# Patient Record
Sex: Male | Born: 1955 | Race: White | Hispanic: No | Marital: Married | Smoking: Former smoker
Health system: Southern US, Community
[De-identification: ages and names within clinical notes are randomized; demographics above are authoritative.]

## PROBLEM LIST (undated history)

## (undated) DIAGNOSIS — J449 Chronic obstructive pulmonary disease, unspecified: Secondary | ICD-10-CM

## (undated) HISTORY — PX: OTHER SURGICAL HISTORY: SHX169

---

## 1998-12-23 ENCOUNTER — Emergency Department (HOSPITAL_COMMUNITY): Admission: EM | Admit: 1998-12-23 | Discharge: 1998-12-23 | Payer: Self-pay | Admitting: *Deleted

## 1999-07-14 ENCOUNTER — Emergency Department (HOSPITAL_COMMUNITY): Admission: EM | Admit: 1999-07-14 | Discharge: 1999-07-14 | Payer: Self-pay | Admitting: Emergency Medicine

## 2000-12-08 ENCOUNTER — Encounter: Payer: Self-pay | Admitting: Emergency Medicine

## 2000-12-08 ENCOUNTER — Emergency Department (HOSPITAL_COMMUNITY): Admission: EM | Admit: 2000-12-08 | Discharge: 2000-12-08 | Payer: Self-pay | Admitting: Emergency Medicine

## 2002-12-24 ENCOUNTER — Inpatient Hospital Stay (HOSPITAL_COMMUNITY): Admission: EM | Admit: 2002-12-24 | Discharge: 2002-12-25 | Payer: Self-pay

## 2002-12-24 ENCOUNTER — Encounter: Payer: Self-pay | Admitting: *Deleted

## 2002-12-24 ENCOUNTER — Encounter: Payer: Self-pay | Admitting: General Surgery

## 2003-02-03 ENCOUNTER — Encounter: Admission: RE | Admit: 2003-02-03 | Discharge: 2003-05-04 | Payer: Self-pay | Admitting: Orthopaedic Surgery

## 2003-05-05 ENCOUNTER — Encounter: Admission: RE | Admit: 2003-05-05 | Discharge: 2003-06-05 | Payer: Self-pay | Admitting: Orthopaedic Surgery

## 2003-07-25 ENCOUNTER — Encounter: Admission: RE | Admit: 2003-07-25 | Discharge: 2003-07-25 | Payer: Self-pay | Admitting: Orthopaedic Surgery

## 2003-08-09 ENCOUNTER — Emergency Department (HOSPITAL_COMMUNITY): Admission: EM | Admit: 2003-08-09 | Discharge: 2003-08-09 | Payer: Self-pay | Admitting: Emergency Medicine

## 2003-10-30 ENCOUNTER — Encounter: Admission: RE | Admit: 2003-10-30 | Discharge: 2003-12-25 | Payer: Self-pay | Admitting: Orthopaedic Surgery

## 2004-09-17 ENCOUNTER — Emergency Department (HOSPITAL_COMMUNITY): Admission: EM | Admit: 2004-09-17 | Discharge: 2004-09-17 | Payer: Self-pay | Admitting: Family Medicine

## 2004-10-15 ENCOUNTER — Emergency Department (HOSPITAL_COMMUNITY): Admission: EM | Admit: 2004-10-15 | Discharge: 2004-10-15 | Payer: Self-pay | Admitting: Family Medicine

## 2004-10-28 ENCOUNTER — Emergency Department (HOSPITAL_COMMUNITY): Admission: EM | Admit: 2004-10-28 | Discharge: 2004-10-28 | Payer: Self-pay | Admitting: Emergency Medicine

## 2004-11-01 ENCOUNTER — Ambulatory Visit: Payer: Self-pay | Admitting: Infectious Diseases

## 2005-10-08 ENCOUNTER — Emergency Department (HOSPITAL_COMMUNITY): Admission: EM | Admit: 2005-10-08 | Discharge: 2005-10-08 | Payer: Self-pay | Admitting: Emergency Medicine

## 2006-05-12 ENCOUNTER — Emergency Department (HOSPITAL_COMMUNITY): Admission: EM | Admit: 2006-05-12 | Discharge: 2006-05-12 | Payer: Self-pay | Admitting: Emergency Medicine

## 2006-05-23 ENCOUNTER — Emergency Department (HOSPITAL_COMMUNITY): Admission: EM | Admit: 2006-05-23 | Discharge: 2006-05-23 | Payer: Self-pay | Admitting: Emergency Medicine

## 2007-11-24 ENCOUNTER — Emergency Department (HOSPITAL_COMMUNITY): Admission: EM | Admit: 2007-11-24 | Discharge: 2007-11-24 | Payer: Self-pay | Admitting: Emergency Medicine
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
## 2011-01-28 LAB — POCT I-STAT, CHEM 8
Chloride: 103
Glucose, Bld: 123 — ABNORMAL HIGH
HCT: 36 — ABNORMAL LOW
Hemoglobin: 12.2 — ABNORMAL LOW

## 2015-08-12 ENCOUNTER — Emergency Department (HOSPITAL_COMMUNITY): Payer: Medicaid Other

## 2015-08-12 ENCOUNTER — Emergency Department (HOSPITAL_COMMUNITY)
Admission: EM | Admit: 2015-08-12 | Discharge: 2015-08-12 | Disposition: A | Discharge status: Home / Self Care | Payer: Medicaid Other | Attending: Emergency Medicine | Admitting: Emergency Medicine

## 2015-08-12 ENCOUNTER — Encounter (HOSPITAL_COMMUNITY): Payer: Self-pay | Admitting: *Deleted

## 2015-08-12 DIAGNOSIS — S3991XA Unspecified injury of abdomen, initial encounter: Secondary | ICD-10-CM | POA: Insufficient documentation

## 2015-08-12 DIAGNOSIS — S3992XA Unspecified injury of lower back, initial encounter: Secondary | ICD-10-CM | POA: Insufficient documentation

## 2015-08-12 DIAGNOSIS — Y9389 Activity, other specified: Secondary | ICD-10-CM | POA: Diagnosis not present

## 2015-08-12 DIAGNOSIS — Z23 Encounter for immunization: Secondary | ICD-10-CM | POA: Insufficient documentation

## 2015-08-12 DIAGNOSIS — S42401A Unspecified fracture of lower end of right humerus, initial encounter for closed fracture: Secondary | ICD-10-CM

## 2015-08-12 DIAGNOSIS — S42491A Other displaced fracture of lower end of right humerus, initial encounter for closed fracture: Secondary | ICD-10-CM | POA: Insufficient documentation

## 2015-08-12 DIAGNOSIS — S29002A Unspecified injury of muscle and tendon of back wall of thorax, initial encounter: Secondary | ICD-10-CM | POA: Insufficient documentation

## 2015-08-12 DIAGNOSIS — S199XXA Unspecified injury of neck, initial encounter: Secondary | ICD-10-CM | POA: Insufficient documentation

## 2015-08-12 DIAGNOSIS — F1110 Opioid abuse, uncomplicated: Secondary | ICD-10-CM | POA: Insufficient documentation

## 2015-08-12 DIAGNOSIS — Y92410 Unspecified street and highway as the place of occurrence of the external cause: Secondary | ICD-10-CM | POA: Insufficient documentation

## 2015-08-12 DIAGNOSIS — F17200 Nicotine dependence, unspecified, uncomplicated: Secondary | ICD-10-CM | POA: Insufficient documentation

## 2015-08-12 DIAGNOSIS — Y998 Other external cause status: Secondary | ICD-10-CM | POA: Diagnosis not present

## 2015-08-12 DIAGNOSIS — S0501XA Injury of conjunctiva and corneal abrasion without foreign body, right eye, initial encounter: Secondary | ICD-10-CM

## 2015-08-12 DIAGNOSIS — S29001A Unspecified injury of muscle and tendon of front wall of thorax, initial encounter: Secondary | ICD-10-CM | POA: Insufficient documentation

## 2015-08-12 DIAGNOSIS — F1310 Sedative, hypnotic or anxiolytic abuse, uncomplicated: Secondary | ICD-10-CM | POA: Diagnosis not present

## 2015-08-12 DIAGNOSIS — S4991XA Unspecified injury of right shoulder and upper arm, initial encounter: Secondary | ICD-10-CM | POA: Diagnosis present

## 2015-08-12 LAB — CBC
HCT: 46.7 % (ref 39.0–52.0)
Hemoglobin: 15.5 g/dL (ref 13.0–17.0)
MCH: 30.1 pg (ref 26.0–34.0)
MCHC: 33.2 g/dL (ref 30.0–36.0)
MCV: 90.7 fL (ref 78.0–100.0)
PLATELETS: 205 10*3/uL (ref 150–400)
RBC: 5.15 MIL/uL (ref 4.22–5.81)
RDW: 12.9 % (ref 11.5–15.5)
WBC: 14.9 10*3/uL — ABNORMAL HIGH (ref 4.0–10.5)

## 2015-08-12 LAB — SAMPLE TO BLOOD BANK

## 2015-08-12 LAB — COMPREHENSIVE METABOLIC PANEL
ALBUMIN: 3.8 g/dL (ref 3.5–5.0)
ALK PHOS: 70 U/L (ref 38–126)
ALT: 21 U/L (ref 17–63)
AST: 34 U/L (ref 15–41)
Anion gap: 9 (ref 5–15)
BILIRUBIN TOTAL: 0.8 mg/dL (ref 0.3–1.2)
CO2: 25 mmol/L (ref 22–32)
CREATININE: 0.82 mg/dL (ref 0.61–1.24)
Calcium: 9.1 mg/dL (ref 8.9–10.3)
Chloride: 107 mmol/L (ref 101–111)
GFR calc Af Amer: >60 mL/min (ref >60)
GLUCOSE: 102 mg/dL — AB (ref 65–99)
POTASSIUM: 4.7 mmol/L (ref 3.5–5.1)
Sodium: 141 mmol/L (ref 135–145)
TOTAL PROTEIN: 6.5 g/dL (ref 6.5–8.1)

## 2015-08-12 LAB — I-STAT TROPONIN, ED
Troponin i, poc: 0 ng/mL (ref 0.00–0.08)
Troponin i, poc: 0 ng/mL (ref 0.00–0.08)

## 2015-08-12 LAB — ETHANOL: Alcohol, Ethyl (B): <5 mg/dL (ref <5)

## 2015-08-12 LAB — I-STAT CHEM 8, ED
BUN: 4 mg/dL — ABNORMAL LOW (ref 6–20)
CHLORIDE: 106 mmol/L (ref 101–111)
CREATININE: 0.7 mg/dL (ref 0.61–1.24)
Calcium, Ion: 1.04 mmol/L — ABNORMAL LOW (ref 1.12–1.23)
Glucose, Bld: 94 mg/dL (ref 65–99)
HCT: 49 % (ref 39.0–52.0)
Hemoglobin: 16.7 g/dL (ref 13.0–17.0)
POTASSIUM: 5.1 mmol/L (ref 3.5–5.1)
Sodium: 140 mmol/L (ref 135–145)
TCO2: 23 mmol/L (ref 0–100)

## 2015-08-12 LAB — RAPID URINE DRUG SCREEN, HOSP PERFORMED
AMPHETAMINES: NOT DETECTED
BARBITURATES: NOT DETECTED
BENZODIAZEPINES: POSITIVE — AB
COCAINE: NOT DETECTED
Opiates: POSITIVE — AB
Tetrahydrocannabinol: NOT DETECTED

## 2015-08-12 LAB — TROPONIN I: Troponin I: <0.03 ng/mL (ref <0.031)

## 2015-08-12 LAB — PROTIME-INR
INR: 1.07 (ref 0.00–1.49)
PROTHROMBIN TIME: 14.1 s (ref 11.6–15.2)

## 2015-08-12 MED ORDER — TETANUS-DIPHTHERIA TOXOIDS TD 5-2 LFU IM INJ
0.5000 mL | INJECTION | Freq: Once | INTRAMUSCULAR | Status: DC
  Filled 2015-08-12: qty 0.5

## 2015-08-12 MED ORDER — FENTANYL CITRATE (PF) 100 MCG/2ML IJ SOLN
50.0000 ug | Freq: Once | INTRAMUSCULAR | Status: AC
  Administered 2015-08-12: 50 ug via INTRAVENOUS
  Filled 2015-08-12: qty 2

## 2015-08-12 MED ORDER — OXYCODONE-ACETAMINOPHEN 5-325 MG PO TABS: 2.0000 | ORAL_TABLET | ORAL | Status: DC | PRN

## 2015-08-12 MED ORDER — SODIUM CHLORIDE 0.9 % IV BOLUS (SEPSIS): 125.0000 mL | Freq: Once | INTRAVENOUS | Status: DC

## 2015-08-12 MED ORDER — TETRACAINE HCL 0.5 % OP SOLN
2.0000 [drp] | Freq: Once | OPHTHALMIC | Status: AC
  Administered 2015-08-12: 2 [drp] via OPHTHALMIC
  Filled 2015-08-12: qty 2

## 2015-08-12 MED ORDER — SODIUM CHLORIDE 0.9 % IV SOLN
1000.0000 mL | Freq: Once | INTRAVENOUS | Status: AC
  Administered 2015-08-12: 1000 mL via INTRAVENOUS

## 2015-08-12 MED ORDER — KETOROLAC TROMETHAMINE 30 MG/ML IJ SOLN
30.0000 mg | Freq: Once | INTRAMUSCULAR | Status: AC
Start: 2015-08-12 — End: 2015-08-12
  Administered 2015-08-12: 30 mg via INTRAVENOUS
  Filled 2015-08-12: qty 1

## 2015-08-12 MED ORDER — ERYTHROMYCIN 5 MG/GM OP OINT: 1.0000 "application " | TOPICAL_OINTMENT | Freq: Four times a day (QID) | OPHTHALMIC | Status: DC

## 2015-08-12 MED ORDER — SODIUM CHLORIDE 0.9 % IV SOLN
1000.0000 mL | INTRAVENOUS | Status: DC
  Administered 2015-08-12: 1000 mL via INTRAVENOUS

## 2015-08-12 MED ORDER — IOPAMIDOL (ISOVUE-300) INJECTION 61%
INTRAVENOUS | Status: AC
  Filled 2015-08-12: qty 100

## 2015-08-12 MED ORDER — OXYCODONE-ACETAMINOPHEN 5-325 MG PO TABS
1.0000 | ORAL_TABLET | Freq: Once | ORAL | Status: AC
Start: 2015-08-12 — End: 2015-08-12
  Administered 2015-08-12: 1 via ORAL
  Filled 2015-08-12: qty 1

## 2015-08-12 MED ORDER — HYDROMORPHONE HCL 1 MG/ML IJ SOLN
1.0000 mg | Freq: Once | INTRAMUSCULAR | Status: AC
  Administered 2015-08-12: 1 mg via INTRAVENOUS
  Filled 2015-08-12: qty 1

## 2015-08-12 MED ORDER — OXYCODONE HCL 5 MG PO TABS
5.0000 mg | ORAL_TABLET | Freq: Once | ORAL | Status: AC
Start: 2015-08-12 — End: 2015-08-12
  Administered 2015-08-12: 5 mg via ORAL
  Filled 2015-08-12: qty 1

## 2015-08-12 MED ORDER — TETANUS-DIPHTH-ACELL PERTUSSIS 5-2.5-18.5 LF-MCG/0.5 IM SUSP
0.5000 mL | Freq: Once | INTRAMUSCULAR | Status: AC
  Administered 2015-08-12: 0.5 mL via INTRAMUSCULAR
  Filled 2015-08-12: qty 0.5

## 2015-08-12 MED ORDER — FLUORESCEIN SODIUM 1 MG OP STRP
1.0000 | ORAL_STRIP | Freq: Once | OPHTHALMIC | Status: AC
  Administered 2015-08-12: 1 via OPHTHALMIC
  Filled 2015-08-12: qty 1

## 2015-08-12 MED ORDER — OXYCODONE-ACETAMINOPHEN 5-325 MG PO TABS
1.0000 | ORAL_TABLET | Freq: Once | ORAL | Status: AC
  Administered 2015-08-12: 1 via ORAL
  Filled 2015-08-12: qty 1
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
## 2015-08-12 NOTE — ED Notes (Signed)
Pt ambulate, pt tolerated well.  Gait steady.

## 2015-08-12 NOTE — ED Notes (Signed)
Family at bedside. 

## 2015-08-12 NOTE — ED Notes (Signed)
Ortho notified for splinting

## 2015-08-12 NOTE — ED Notes (Signed)
[employer]-  R 20/70  L 20/100

## 2015-08-12 NOTE — ED Notes (Signed)
Upgraded to Level 2 per MD Rancour
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
## 2015-08-12 NOTE — Discharge Instructions (Signed)
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
## 2015-08-12 NOTE — Progress Notes (Signed)
Orthopedic Tech Progress Note Patient Details:  Alben Deedsimothy C Covin [birth year] [id number] Applied fiberglass sugar tong splint to RUE.  Pulses, motion, sensation intact before and after splinting.  Capillary refill less than 2 seconds before and after splinting.  Placed splinted RUE in shoulder immobilizer sling.  Questioned ED MD about sugar tong splint as opposed to a coaptation splint for the humerus fx, but she stated she had checked with Dr. Lajoyce Cornersuda and he specified a sugar tong splint. Ortho Devices Type of Ortho Device: Sugartong splint, Sling immobilizer Ortho Device/Splint Location: RUE Ortho Device/Splint Interventions: Application   Lesle ChrisGilliland, Chiann Goffredo L 08/12/2015, 5:41 PM

## 2015-08-12 NOTE — ED Notes (Signed)
MD Rancour at bedside 
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
## 2015-08-12 NOTE — ED Notes (Signed)
C-collar removed-ok per MD Little.

## 2015-08-12 NOTE — ED Notes (Signed)
Pt placed in paper scrubs, all belongings given to daughter: April, wallet, phone, and clothing.

## 2015-08-13 MED ORDER — IOPAMIDOL (ISOVUE-300) INJECTION 61%
100.0000 mL | Freq: Once | INTRAVENOUS | Status: AC | PRN
  Administered 2015-08-12: 100 mL via INTRAVENOUS

## 2015-09-05 ENCOUNTER — Emergency Department (HOSPITAL_BASED_OUTPATIENT_CLINIC_OR_DEPARTMENT_OTHER)
Admission: EM | Admit: 2015-09-05 | Discharge: 2015-09-05 | Disposition: A | Discharge status: Home / Self Care | Payer: No Typology Code available for payment source | Attending: Emergency Medicine | Admitting: Emergency Medicine

## 2015-09-05 ENCOUNTER — Encounter (HOSPITAL_BASED_OUTPATIENT_CLINIC_OR_DEPARTMENT_OTHER): Payer: Self-pay | Admitting: Emergency Medicine

## 2015-09-05 ENCOUNTER — Emergency Department (HOSPITAL_BASED_OUTPATIENT_CLINIC_OR_DEPARTMENT_OTHER): Payer: No Typology Code available for payment source

## 2015-09-05 DIAGNOSIS — Y939 Activity, unspecified: Secondary | ICD-10-CM | POA: Insufficient documentation

## 2015-09-05 DIAGNOSIS — S4991XD Unspecified injury of right shoulder and upper arm, subsequent encounter: Secondary | ICD-10-CM | POA: Diagnosis present

## 2015-09-05 DIAGNOSIS — F17200 Nicotine dependence, unspecified, uncomplicated: Secondary | ICD-10-CM | POA: Diagnosis not present

## 2015-09-05 DIAGNOSIS — S42331D Displaced oblique fracture of shaft of humerus, right arm, subsequent encounter for fracture with routine healing: Secondary | ICD-10-CM | POA: Insufficient documentation

## 2015-09-05 DIAGNOSIS — G8929 Other chronic pain: Secondary | ICD-10-CM

## 2015-09-05 DIAGNOSIS — Y999 Unspecified external cause status: Secondary | ICD-10-CM | POA: Insufficient documentation

## 2015-09-05 DIAGNOSIS — Y929 Unspecified place or not applicable: Secondary | ICD-10-CM | POA: Insufficient documentation

## 2015-09-05 DIAGNOSIS — S42301K Unspecified fracture of shaft of humerus, right arm, subsequent encounter for fracture with nonunion: Secondary | ICD-10-CM

## 2015-09-05 MED ORDER — OXYCODONE HCL 5 MG PO TABS
5.0000 mg | ORAL_TABLET | ORAL | Status: DC | PRN
Start: 2015-09-05 — End: 2018-10-08

## 2015-09-05 MED ORDER — OXYCODONE HCL 5 MG PO TABS
10.0000 mg | ORAL_TABLET | Freq: Once | ORAL | Status: AC
  Administered 2015-09-05: 10 mg via ORAL
  Filled 2015-09-05: qty 2
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
## 2015-09-05 NOTE — Discharge Instructions (Signed)
Call Dr.Xu on Monday to make appointment to have splint repaired.  Follow-up with your regular primary care physician Dr. Thea SilversmithMackenzie to discuss additional narcotic medication.  Dr. Romilda JoyJanes major aware today that additional narcotics may potentially dilate your pain contract with Dr. Thea SilversmithMackenzie. You have expressed understanding of this.

## 2016-09-20 ENCOUNTER — Other Ambulatory Visit (INDEPENDENT_AMBULATORY_CARE_PROVIDER_SITE_OTHER): Payer: Self-pay | Admitting: Orthopaedic Surgery

## 2016-10-24 ENCOUNTER — Telehealth (INDEPENDENT_AMBULATORY_CARE_PROVIDER_SITE_OTHER): Payer: Self-pay

## 2016-10-24 MED ORDER — METHOCARBAMOL 500 MG PO TABS: 500.0000 mg | ORAL_TABLET | Freq: Three times a day (TID) | ORAL | 0 refills | Status: DC | PRN

## 2016-10-24 NOTE — Telephone Encounter (Signed)
Patient is requesting rxrf on muscle relaxer.  Request refill sent from patients pharmacy

## 2016-10-24 NOTE — Telephone Encounter (Signed)
Robaxin 500 mg tid prn #30

## 2016-10-24 NOTE — Telephone Encounter (Signed)
Refill submitted. 

## 2016-10-24 NOTE — Addendum Note (Signed)
Addended byPrescott Parma: Kaimana Lurz on: 10/24/2016 02:59 PM   Modules accepted: Orders
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
## 2018-10-08 ENCOUNTER — Encounter (HOSPITAL_COMMUNITY): Payer: Self-pay | Admitting: Psychiatry

## 2018-10-08 ENCOUNTER — Other Ambulatory Visit: Payer: Self-pay

## 2018-10-08 ENCOUNTER — Ambulatory Visit (INDEPENDENT_AMBULATORY_CARE_PROVIDER_SITE_OTHER): Payer: Medicaid Other | Admitting: Psychiatry

## 2018-10-08 DIAGNOSIS — F410 Panic disorder [episodic paroxysmal anxiety] without agoraphobia: Secondary | ICD-10-CM | POA: Diagnosis not present

## 2018-10-08 DIAGNOSIS — M549 Dorsalgia, unspecified: Secondary | ICD-10-CM | POA: Insufficient documentation

## 2018-10-08 DIAGNOSIS — F4321 Adjustment disorder with depressed mood: Secondary | ICD-10-CM | POA: Diagnosis not present

## 2018-10-08 DIAGNOSIS — F411 Generalized anxiety disorder: Secondary | ICD-10-CM

## 2018-10-08 DIAGNOSIS — J449 Chronic obstructive pulmonary disease, unspecified: Secondary | ICD-10-CM | POA: Insufficient documentation

## 2018-10-08 MED ORDER — VENLAFAXINE HCL ER 37.5 MG PO CP24: ORAL_CAPSULE | ORAL | 1 refills | Status: DC
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
## 2019-04-01 DIAGNOSIS — F329 Major depressive disorder, single episode, unspecified: Secondary | ICD-10-CM

## 2019-04-01 DIAGNOSIS — J189 Pneumonia, unspecified organism: Secondary | ICD-10-CM

## 2019-04-01 DIAGNOSIS — J441 Chronic obstructive pulmonary disease with (acute) exacerbation: Secondary | ICD-10-CM

## 2019-04-01 DIAGNOSIS — R0902 Hypoxemia: Secondary | ICD-10-CM

## 2019-05-14 ENCOUNTER — Encounter (HOSPITAL_COMMUNITY): Payer: Self-pay | Admitting: Emergency Medicine

## 2019-05-14 ENCOUNTER — Other Ambulatory Visit: Payer: Self-pay

## 2019-05-14 ENCOUNTER — Emergency Department (HOSPITAL_COMMUNITY)
Admission: EM | Admit: 2019-05-14 | Discharge: 2019-05-15 | Disposition: A | Discharge status: Home / Self Care | Payer: Medicaid Other | Attending: Emergency Medicine | Admitting: Emergency Medicine

## 2019-05-14 DIAGNOSIS — Z87891 Personal history of nicotine dependence: Secondary | ICD-10-CM | POA: Insufficient documentation

## 2019-05-14 DIAGNOSIS — Y9389 Activity, other specified: Secondary | ICD-10-CM | POA: Diagnosis not present

## 2019-05-14 DIAGNOSIS — Y92009 Unspecified place in unspecified non-institutional (private) residence as the place of occurrence of the external cause: Secondary | ICD-10-CM | POA: Diagnosis not present

## 2019-05-14 DIAGNOSIS — T310 Burns involving less than 10% of body surface: Secondary | ICD-10-CM | POA: Diagnosis not present

## 2019-05-14 DIAGNOSIS — T300 Burn of unspecified body region, unspecified degree: Secondary | ICD-10-CM

## 2019-05-14 DIAGNOSIS — X04XXXA Exposure to ignition of highly flammable material, initial encounter: Secondary | ICD-10-CM | POA: Diagnosis not present

## 2019-05-14 DIAGNOSIS — Z23 Encounter for immunization: Secondary | ICD-10-CM | POA: Diagnosis not present

## 2019-05-14 DIAGNOSIS — J449 Chronic obstructive pulmonary disease, unspecified: Secondary | ICD-10-CM | POA: Insufficient documentation

## 2019-05-14 DIAGNOSIS — Z79899 Other long term (current) drug therapy: Secondary | ICD-10-CM | POA: Diagnosis not present

## 2019-05-14 DIAGNOSIS — T2029XA Burn of second degree of multiple sites of head, face, and neck, initial encounter: Secondary | ICD-10-CM | POA: Diagnosis not present

## 2019-05-14 DIAGNOSIS — Z9981 Dependence on supplemental oxygen: Secondary | ICD-10-CM | POA: Diagnosis not present

## 2019-05-14 DIAGNOSIS — Y999 Unspecified external cause status: Secondary | ICD-10-CM | POA: Diagnosis not present

## 2019-05-14 DIAGNOSIS — T2019XA Burn of first degree of multiple sites of head, face, and neck, initial encounter: Secondary | ICD-10-CM | POA: Diagnosis not present

## 2019-05-14 DIAGNOSIS — T2009XA Burn of unspecified degree of multiple sites of head, face, and neck, initial encounter: Secondary | ICD-10-CM | POA: Diagnosis present

## 2019-05-14 HISTORY — DX: Chronic obstructive pulmonary disease, unspecified: J44.9

## 2019-05-14 LAB — BASIC METABOLIC PANEL
Anion gap: 11 (ref 5–15)
BUN: 7 mg/dL — ABNORMAL LOW (ref 8–23)
CO2: 25 mmol/L (ref 22–32)
Calcium: 9.4 mg/dL (ref 8.9–10.3)
Chloride: 104 mmol/L (ref 98–111)
Creatinine, Ser: 0.8 mg/dL (ref 0.61–1.24)
GFR calc Af Amer: >60 mL/min (ref >60)
GFR calc non Af Amer: >60 mL/min (ref >60)
Glucose, Bld: 143 mg/dL — ABNORMAL HIGH (ref 70–99)
Potassium: 3.8 mmol/L (ref 3.5–5.1)
Sodium: 140 mmol/L (ref 135–145)

## 2019-05-14 LAB — CBC
HCT: 44 % (ref 39.0–52.0)
Hemoglobin: 14.5 g/dL (ref 13.0–17.0)
MCH: 31.7 pg (ref 26.0–34.0)
MCHC: 33 g/dL (ref 30.0–36.0)
MCV: 96.3 fL (ref 80.0–100.0)
Platelets: 254 10*3/uL (ref 150–400)
RBC: 4.57 MIL/uL (ref 4.22–5.81)
RDW: 13.1 % (ref 11.5–15.5)
WBC: 11.3 10*3/uL — ABNORMAL HIGH (ref 4.0–10.5)
nRBC: 0 % (ref 0.0–0.2)
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
## 2019-05-15 MED ORDER — TETANUS-DIPHTH-ACELL PERTUSSIS 5-2.5-18.5 LF-MCG/0.5 IM SUSP
0.5000 mL | Freq: Once | INTRAMUSCULAR | Status: AC
  Administered 2019-05-15: 0.5 mL via INTRAMUSCULAR
  Filled 2019-05-15: qty 0.5

## 2019-05-15 MED ORDER — CEPHALEXIN 500 MG PO CAPS: 500.0000 mg | ORAL_CAPSULE | Freq: Two times a day (BID) | ORAL | 0 refills | Status: AC

## 2019-05-15 MED ORDER — BACITRACIN ZINC 500 UNIT/GM EX OINT: 1.0000 "application " | TOPICAL_OINTMENT | Freq: Three times a day (TID) | CUTANEOUS | 0 refills | Status: DC | PRN
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
## 2019-05-15 NOTE — Discharge Instructions (Signed)
Use bacitracin to your face 3-4 times a day until wounds have healed, take antibiotics as prescribed, follow-up closely with your primary care doctor and return to the ED if any signs of worsening infection develop, only use your oxygen if needed.
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
## 2019-09-12 ENCOUNTER — Encounter: Payer: Self-pay | Admitting: Pulmonary Disease

## 2019-09-12 ENCOUNTER — Ambulatory Visit (INDEPENDENT_AMBULATORY_CARE_PROVIDER_SITE_OTHER): Payer: Medicaid Other

## 2019-09-12 ENCOUNTER — Ambulatory Visit: Payer: Medicaid Other | Admitting: Pulmonary Disease

## 2019-09-12 ENCOUNTER — Other Ambulatory Visit (INDEPENDENT_AMBULATORY_CARE_PROVIDER_SITE_OTHER): Payer: Medicaid Other

## 2019-09-12 ENCOUNTER — Other Ambulatory Visit: Payer: Self-pay

## 2019-09-12 VITALS — BP 130/62 | HR 71 | Temp 98.3°F | Ht 69.0 in | Wt 185.9 lb

## 2019-09-12 DIAGNOSIS — R0602 Shortness of breath: Secondary | ICD-10-CM

## 2019-09-12 LAB — CBC WITH DIFFERENTIAL/PLATELET
Basophils Absolute: 0.1 10*3/uL (ref 0.0–0.1)
Basophils Relative: 0.8 % (ref 0.0–3.0)
Eosinophils Absolute: 0.4 10*3/uL (ref 0.0–0.7)
Eosinophils Relative: 3.9 % (ref 0.0–5.0)
HCT: 46.1 % (ref 39.0–52.0)
Hemoglobin: 15.8 g/dL (ref 13.0–17.0)
Lymphocytes Relative: 28 % (ref 12.0–46.0)
Lymphs Abs: 2.7 10*3/uL (ref 0.7–4.0)
MCHC: 34.3 g/dL (ref 30.0–36.0)
MCV: 92.8 fl (ref 78.0–100.0)
Monocytes Absolute: 0.8 10*3/uL (ref 0.1–1.0)
Monocytes Relative: 8.1 % (ref 3.0–12.0)
Neutro Abs: 5.7 10*3/uL (ref 1.4–7.7)
Neutrophils Relative %: 59.2 % (ref 43.0–77.0)
Platelets: 190 10*3/uL (ref 150.0–400.0)
RBC: 4.97 Mil/uL (ref 4.22–5.81)
RDW: 13.4 % (ref 11.5–15.5)
WBC: 9.7 10*3/uL (ref 4.0–10.5)

## 2019-09-12 MED ORDER — PREDNISONE 10 MG PO TABS: ORAL_TABLET | ORAL | 1 refills | Status: DC

## 2019-09-12 NOTE — Patient Instructions (Signed)
We will schedule you for labs today including CBC differential, IgE, alpha-1 antitrypsin levels and phenotype Order pulmonary function test and chest x-ray Order prednisone 40 mg a day for 5 days and then maintain at 10 mg a day until return to clinic Continue Trelegy inhaler, nebs, supplemental oxygen  Follow-up in 1 to 2 months.

## 2019-09-12 NOTE — Progress Notes (Signed)
Chris [name]    [id number]    1955/08/14  Primary Care Physician:[facility]  Referring Physician: [facility] [address] [name],  [place] [address]  Chief complaint: Consult for COPD  HPI: 64 year old ex-smoker with history of anxiety, COPD Complains of chronic dyspnea on exertion, cough with sputum production, wheezing.  No fevers or chills Previously followed by Dr. Delbert [name] at [facility]. Maintained on Trelegy inhaler, he is on 3 L supplemental oxygen and nebs.  Has been on multiple prednisone rounds from his primary care with improvement in symptoms temporarily Hospitalized at [facility] in late 2020 for pneumonia.   Pets: No pets Occupation: Used to work in Holiday representative Exposures: Does not know if he is exposed to asbestos.  No mold, hot tub, Jacuzzi.  No down pillows or comforters Smoking history: 20-pack-year smoker.  Used to smoke marijuana up to age 78.  Picked up tobacco smoking at age 51 and quit the past week. Travel history: No significant travel history Relevant family history: Parents had COPD.  They were smokers.   Outpatient Encounter Medications as of 09/12/2019  Medication Sig  . albuterol (VENTOLIN HFA) 108 (90 Base) MCG/ACT inhaler INHALE 2 PUFFS BY MOUTH EVERY 4 6 HOURS AS NEEDED  . ALPRAZolam (XANAX) 0.5 MG tablet Take 0.5 mg by mouth 2 (two) times daily.  . Buprenorphine HCl-Naloxone HCl 8-2 MG FILM Place under the tongue.  . cyclobenzaprine (FLEXERIL) 10 MG tablet Take 10 mg by mouth 3 (three) times daily as needed.  . venlafaxine XR (EFFEXOR XR) 37.5 MG 24 hr capsule Take one capsule daily for 1 week and than twice daily  . bacitracin ointment Apply 1 application topically 3 (three) times daily as needed for wound care. (Patient not taking: Reported on 09/12/2019)  . erythromycin ophthalmic ointment Place 1 application into the right eye every 6 (six) hours. Place 1/2 inch ribbon of ointment in the  affected eye 4 times a day for 5 days (Patient not taking: Reported on 09/12/2019)   No facility-administered encounter medications on file as of 09/12/2019.    Allergies as of 09/12/2019  . (No Known Allergies)    Past Medical History:  Diagnosis Date  . COPD (chronic obstructive pulmonary disease) (HCC)     Past Surgical History:  Procedure Laterality Date  . arm surgery      No family history on file.  Social History   Socioeconomic History  . Marital status: Married    Spouse name: Not on file  . Number of children: Not on file  . Years of education: Not on file  . Highest education level: Not on file  Occupational History  . Not on file  Tobacco Use  . Smoking status: Former Games developer  . Smokeless tobacco: Never Used  Substance and Sexual Activity  . Alcohol use: Yes  . Drug use: No  . Sexual activity: Not on file  Other Topics Concern  . Not on file  Social History Narrative  . Not on file   Social Determinants of Health   Financial Resource Strain:   . Difficulty of Paying Living Expenses:   Food Insecurity:   . Worried About Programme researcher, broadcasting/film/video in the Last Year:   . Barista in the Last Year:   Transportation Needs:   . Freight forwarder (Medical):   Marland Kitchen Lack of Transportation (Non-Medical):   Physical Activity:   . Days of Exercise  per Week:   . Minutes of Exercise per Session:   Stress:   . Feeling of Stress :   Social Connections:   . Frequency of Communication with Friends and Family:   . Frequency of Social Gatherings with Friends and Family:   . Attends Religious Services:   . Active Member of Clubs or Organizations:   . Attends Archivist Meetings:   Marland Kitchen Marital Status:   Intimate Partner Violence:   . Fear of Current or Ex-Partner:   . Emotionally Abused:   Marland Kitchen Physically Abused:   . Sexually Abused:     Review of systems: Review of Systems  Constitutional: Negative for fever and chills.  HENT: Negative.   Eyes:  Negative for blurred vision.  Respiratory: as per HPI  Cardiovascular: Negative for chest pain and palpitations.  Gastrointestinal: Negative for vomiting, diarrhea, blood per rectum. Genitourinary: Negative for dysuria, urgency, frequency and hematuria.  Musculoskeletal: Negative for myalgias, back pain and joint pain.  Skin: Negative for itching and rash.  Neurological: Negative for dizziness, tremors, focal weakness, seizures and loss of consciousness.  Endo/Heme/Allergies: Negative for environmental allergies.  Psychiatric/Behavioral: Negative for depression, suicidal ideas and hallucinations.  All other systems reviewed and are negative.  Physical Exam: Blood pressure 130/62, pulse 71, temperature 98.3 F (36.8 C), temperature source Temporal, height 5\' 9"  (1.753 m), weight 185 lb 14.4 oz (84.3 kg), SpO2 95 %. Gen:      No acute distress HEENT:  EOMI, sclera anicteric Neck:     No masses; no thyromegaly Lungs:    Bilateral crackles, wheeze CV:         Regular rate and rhythm; no murmurs Abd:      + bowel sounds; soft, non-tender; no palpable masses, no distension Ext:    No edema; adequate peripheral perfusion Skin:      Warm and dry; no rash Neuro: alert and oriented x 3 Psych: normal mood and affect  Data Reviewed: Imaging: CTA chest [[place]] 03/01/2019-no pulmonary embolism, mild emphysema, airspace disease in the right lower lobe.  I have reviewed the images personally.   Assessment:  COPD Likely has severe COPD based on presentation, symptoms though with mild emphysema on CT scan Get records from prior pulmonologist Continue Trelegy inhaler, nebs Schedule pulmonary function test, chest x-ray CBC, IgE and alpha-1 antitrypsin. He is wheezing on examination today.  We will treat him with prednisone burst for 5 days and then continue 10 mg a day until he can be seen again. Based on review of PFTs we may consider chronic azithromycin or  Daliresp.  Plan/Recommendations: Prednisone 40 mg a day for 5 days and then 10 mg daily Continue Trelegy, nebs Pulmonary function test, chest x-ray, CBC, IgE, alpha-1 antitrypsin  Marshell Garfinkel MD [facility] 09/12/2019, 3:23 PM  CC: [facility]

## 2019-09-19 NOTE — Progress Notes (Signed)
CXR result called to patient.  Verbalized understanding.

## 2019-09-20 ENCOUNTER — Telehealth: Payer: Self-pay | Admitting: Pulmonary Disease

## 2019-09-20 NOTE — Telephone Encounter (Signed)
Spoke with daughter and she states pt told her that he would have to be in Hospice because he is "dying". I explained to the daughter that he was seen and diagnosed with severe COPD and that Dr. Isaiah Serge had no notes about sending pt to hospice. She understood and nothing further is needed.

## 2019-09-20 NOTE — Telephone Encounter (Signed)
Pt's daughter called back giving another number to call Chong Sicilian [phone].  Pt has appt w/PCP at 2:30.  Was wanting to verify what was discussed w/pt yesterday and a couple of other questions.

## 2019-09-23 ENCOUNTER — Telehealth: Payer: Self-pay | Admitting: Pulmonary Disease

## 2019-09-23 LAB — ALPHA-1 ANTITRYPSIN PHENOTYPE: A-1 Antitrypsin, Ser: 144 mg/dL (ref 83–199)

## 2019-09-23 LAB — IGE: IgE (Immunoglobulin E), Serum: 77 kU/L (ref <=114)

## 2019-09-23 NOTE — Telephone Encounter (Signed)
Called and spoke with pt's daughter April letting her know the info stated by Dr. Isaiah Serge and she verbalized understanding. Nothing further needed.
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
## 2019-09-23 NOTE — Telephone Encounter (Signed)
He should be ok if it is a minor procedure

## 2019-10-02 ENCOUNTER — Ambulatory Visit: Payer: Medicaid Other | Admitting: Acute Care

## 2019-10-02 NOTE — Progress Notes (Deleted)
History of Present Illness Chris [name] is a 64 y.o. male former smoker with emphysema and COPD seen by Dr. Isaiah Serge for consult 09/13/2019.   Synopsis 64 year old ex-smoker with history of anxiety, COPD Complains of chronic dyspnea on exertion, cough with sputum production, wheezing.  No fevers or chills Previously followed by Dr. Delbert Phenix at [facility]. Maintained on Trelegy inhaler, he is on 3 L supplemental oxygen and nebs.  Has been on multiple prednisone rounds from his primary care with improvement in symptoms temporarily Hospitalized at [facility] in late 2020 for pneumonia.   Pets: No pets Occupation: Used to work in Holiday representative Exposures: Does not know if he is exposed to asbestos.  No mold, hot tub, Jacuzzi.  No down pillows or comforters Smoking history: 20-pack-year smoker.  Used to smoke marijuana up to age 84.  Picked up tobacco smoking at age 78 and quit the past week. Travel history: No significant travel history Relevant family history: Parents had COPD.  They were smokers.   10/02/2019 Follow up for Dyspnea Pt was referred to Dr. Isaiah Serge for consult 09/13/2019 by [facility] for evaluation for COPD. Dr. Isaiah Serge suspects severe COPD based on initial OV. Plan was to  obtain  records from prior pulmonologist ( release of information signed 5/14). Continue Trelegy inhaler, and nebs,  Schedule pulmonary function test, and obtain the below documented  labs. He treated him with prednisone burst for 5 days and then continue 10 mg a day until he can be seen again. Based on review of PFTs plan was to  consider chronic azithromycin or Daliresp. The patient has not yet done  his PFT's, but has completed his prednisone burst of 40 mg x 5 days, and is currently on 10 mg daily as instructed.  Pt. States he has been   Plan/Recommendations: Prednisone 40 mg a day for 5 days and then 10 mg daily Continue Trelegy, nebs Pulmonary function test, chest x-ray, CBC, IgE, alpha-1  antitrypsin   Data Reviewed: Imaging: CXR 09/13/2019 Normal heart size, mediastinal contours, and pulmonary vascularity. Emphysematous and bronchitic changes consistent with COPD. No pulmonary infiltrate, pleural effusion or pneumothorax. IMPRESSION: COPD changes without acute abnormalities.  CTA chest [[place]] 03/01/2019-no pulmonary embolism, mild emphysema, airspace disease in the right lower lobe.   Labs 09/13/2019 Alpha 1>> 144     ALPHA-1-ANTITRYPSIN (AAT) PHENOTYPE  SEE NOTE   Comment: THIS PATIENT'S ALPHA-1-ANTITRYPSIN PHENOTYPE IS PI*MM.    IgE>> 77 CBC Latest Ref Rng & Units 09/12/2019 05/14/2019 08/12/2015  WBC 4.0 - 10.5 K/uL 9.7 11.3(H) 14.9(H)  Hemoglobin 13.0 - 17.0 g/dL 81.1 91.4 78.2  Hematocrit 39.0 - 52.0 % 46.1 44.0 46.7  Platelets 150.0 - 400.0 K/uL 190.0 254 205     CBC Latest Ref Rng & Units 09/12/2019 05/14/2019 08/12/2015  WBC 4.0 - 10.5 K/uL 9.7 11.3(H) 14.9(H)  Hemoglobin 13.0 - 17.0 g/dL 95.6 21.3 08.6  Hematocrit 39.0 - 52.0 % 46.1 44.0 46.7  Platelets 150.0 - 400.0 K/uL 190.0 254 205    BMP Latest Ref Rng & Units 05/14/2019 08/12/2015 08/12/2015  Glucose 70 - 99 mg/dL 578(I) 696(E) 94  BUN 8 - 23 mg/dL 7(L) <9(B) 4(L)  Creatinine 0.61 - 1.24 mg/dL 2.84 1.32 4.40  Sodium 135 - 145 mmol/L 140 141 140  Potassium 3.5 - 5.1 mmol/L 3.8 4.7 5.1  Chloride 98 - 111 mmol/L 104 107 106  CO2 22 - 32 mmol/L 25 25 -  Calcium 8.9 - 10.3 mg/dL 9.4 9.1 -  BNP No results found for: BNP  ProBNP No results found for: PROBNP  PFT No results found for: FEV1PRE, FEV1POST, FVCPRE, FVCPOST, TLC, DLCOUNC, PREFEV1FVCRT, PSTFEV1FVCRT  DG Chest 2 View  Result Date: 09/13/2019 CLINICAL DATA:  Shortness of breath evaluation EXAM: CHEST - 2 VIEW COMPARISON:  04/06/2019 FINDINGS: Normal heart size, mediastinal contours, and pulmonary vascularity. Emphysematous and bronchitic changes consistent with COPD. No pulmonary infiltrate, pleural effusion or pneumothorax.  Prior lumbar fusion and RIGHT shoulder arthroplasty. Osseous demineralization. IMPRESSION: COPD changes without acute abnormalities. Electronically Signed   By: Ulyses Southward M.D.   On: 09/13/2019 10:17     Past medical hx Past Medical History:  Diagnosis Date  . COPD (chronic obstructive pulmonary disease) (HCC)      Social History   Tobacco Use  . Smoking status: Former Games developer  . Smokeless tobacco: Never Used  Substance Use Topics  . Alcohol use: Yes  . Drug use: No    [name] reports that he has quit smoking. He has never used smokeless tobacco. He reports current alcohol use. He reports that he does not use drugs.  Tobacco Cessation: Former smoker, quit about 1 month ago. ( 08/2019)  Past surgical hx, Family hx, Social hx all reviewed.  Current Outpatient Medications on File Prior to Visit  Medication Sig  . albuterol (VENTOLIN HFA) 108 (90 Base) MCG/ACT inhaler INHALE 2 PUFFS BY MOUTH EVERY 4 6 HOURS AS NEEDED  . ALPRAZolam (XANAX) 0.5 MG tablet Take 0.5 mg by mouth 2 (two) times daily.  . bacitracin ointment Apply 1 application topically 3 (three) times daily as needed for wound care. (Patient not taking: Reported on 09/12/2019)  . Buprenorphine HCl-Naloxone HCl 8-2 MG FILM Place under the tongue.  . cyclobenzaprine (FLEXERIL) 10 MG tablet Take 10 mg by mouth 3 (three) times daily as needed.  Marland Kitchen erythromycin ophthalmic ointment Place 1 application into the right eye every 6 (six) hours. Place 1/2 inch ribbon of ointment in the affected eye 4 times a day for 5 days (Patient not taking: Reported on 09/12/2019)  . predniSONE (DELTASONE) 10 MG tablet 40mg  daily x5 days then 10mg  daily.  venlafaxine XR (EFFEXOR XR) 37.5 MG 24 hr capsule Take one capsule daily for 1 week and than twice daily   No current facility-administered medications on file prior to visit.     No Known Allergies  Review Of Systems:  Constitutional:   No  weight loss, night sweats,  Fevers, chills,  fatigue, or  lassitude.  HEENT:   No headaches,  Difficulty swallowing,  Tooth/dental problems, or  Sore throat,                No sneezing, itching, ear ache, nasal congestion, post nasal drip,   CV:  No chest pain,  Orthopnea, PND, swelling in lower extremities, anasarca, dizziness, palpitations, syncope.   GI  No heartburn, indigestion, abdominal pain, nausea, vomiting, diarrhea, change in bowel habits, loss of appetite, bloody stools.   Resp: No shortness of breath with exertion or at rest.  No excess mucus, no productive cough,  No non-productive cough,  No coughing up of blood.  No change in color of mucus.  No wheezing.  No chest wall deformity  Skin: no rash or lesions.  GU: no dysuria, change in color of urine, no urgency or frequency.  No flank pain, no hematuria   MS:  No joint pain or swelling.  No decreased range of motion.  No back pain.  Psych:  No change in mood or affect. No depression or anxiety.  No memory loss.   Vital Signs There were no vitals taken for this visit.   Physical Exam:  General- No distress,  A&Ox3 ENT: No sinus tenderness, TM clear, pale nasal mucosa, no oral exudate,no post nasal drip, no LAN Cardiac: S1, S2, regular rate and rhythm, no murmur Chest: No wheeze/ rales/ dullness; no accessory muscle use, no nasal flaring, no sternal retractions Abd.: Soft Non-tender Ext: No clubbing cyanosis, edema Neuro:  normal strength Skin: No rashes, warm and dry Psych: normal mood and behavior   Assessment/Plan  No problem-specific Assessment & Plan notes found for this encounter.    Magdalen Spatz, NP 10/02/2019  11:21 AM

## 2019-11-14 ENCOUNTER — Ambulatory Visit: Payer: Medicaid Other | Admitting: Primary Care

## 2019-12-12 ENCOUNTER — Encounter: Payer: Medicaid Other | Admitting: Adult Health

## 2019-12-19 ENCOUNTER — Ambulatory Visit: Payer: Medicaid Other | Admitting: Primary Care

## 2021-07-04 IMAGING — DX DG CHEST 2V
2 series · 2 of 2 positions shown · non-contrast
Comparison: 04/06/2019

CLINICAL DATA: Shortness of breath evaluation

EXAM:
CHEST - 2 VIEW

[chest pa]
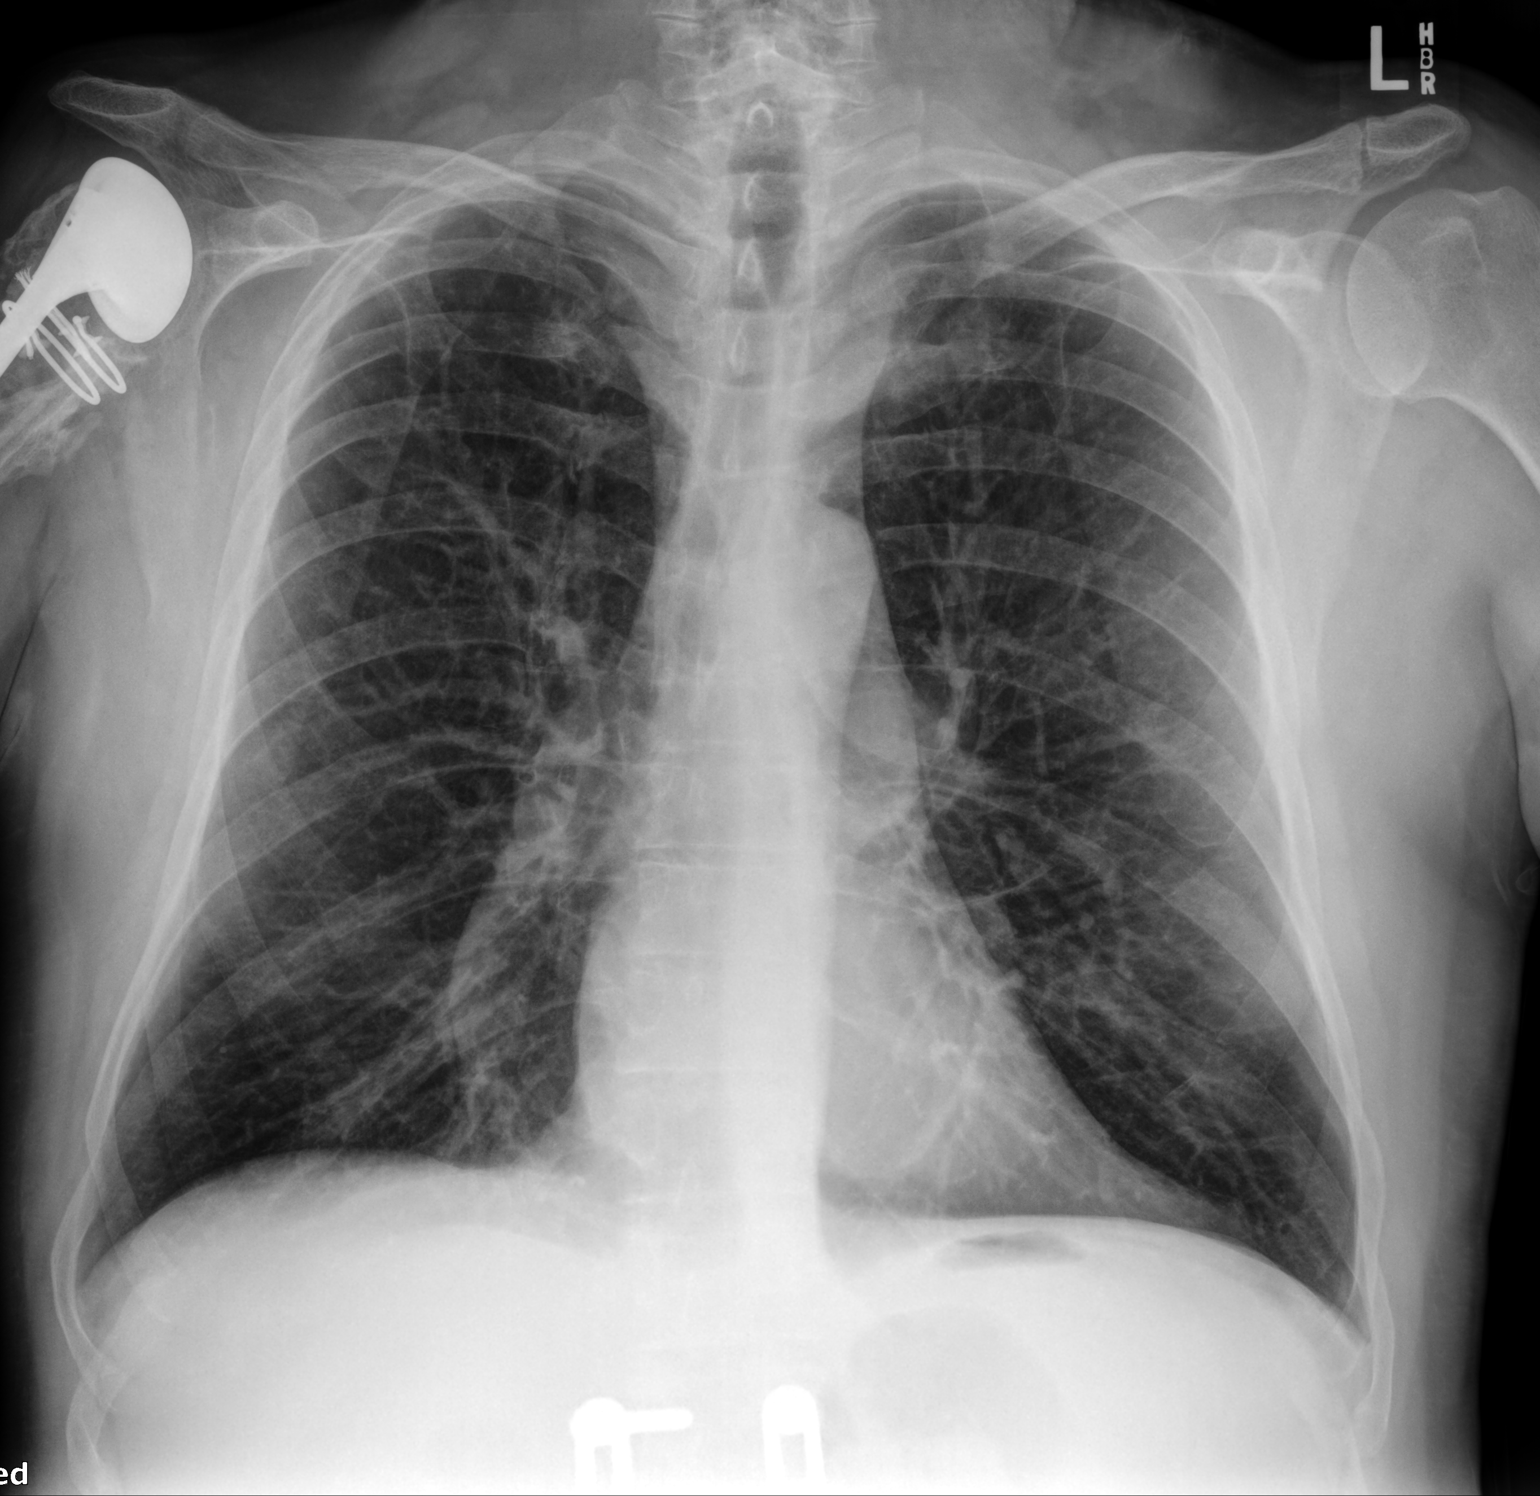

[chest lat]
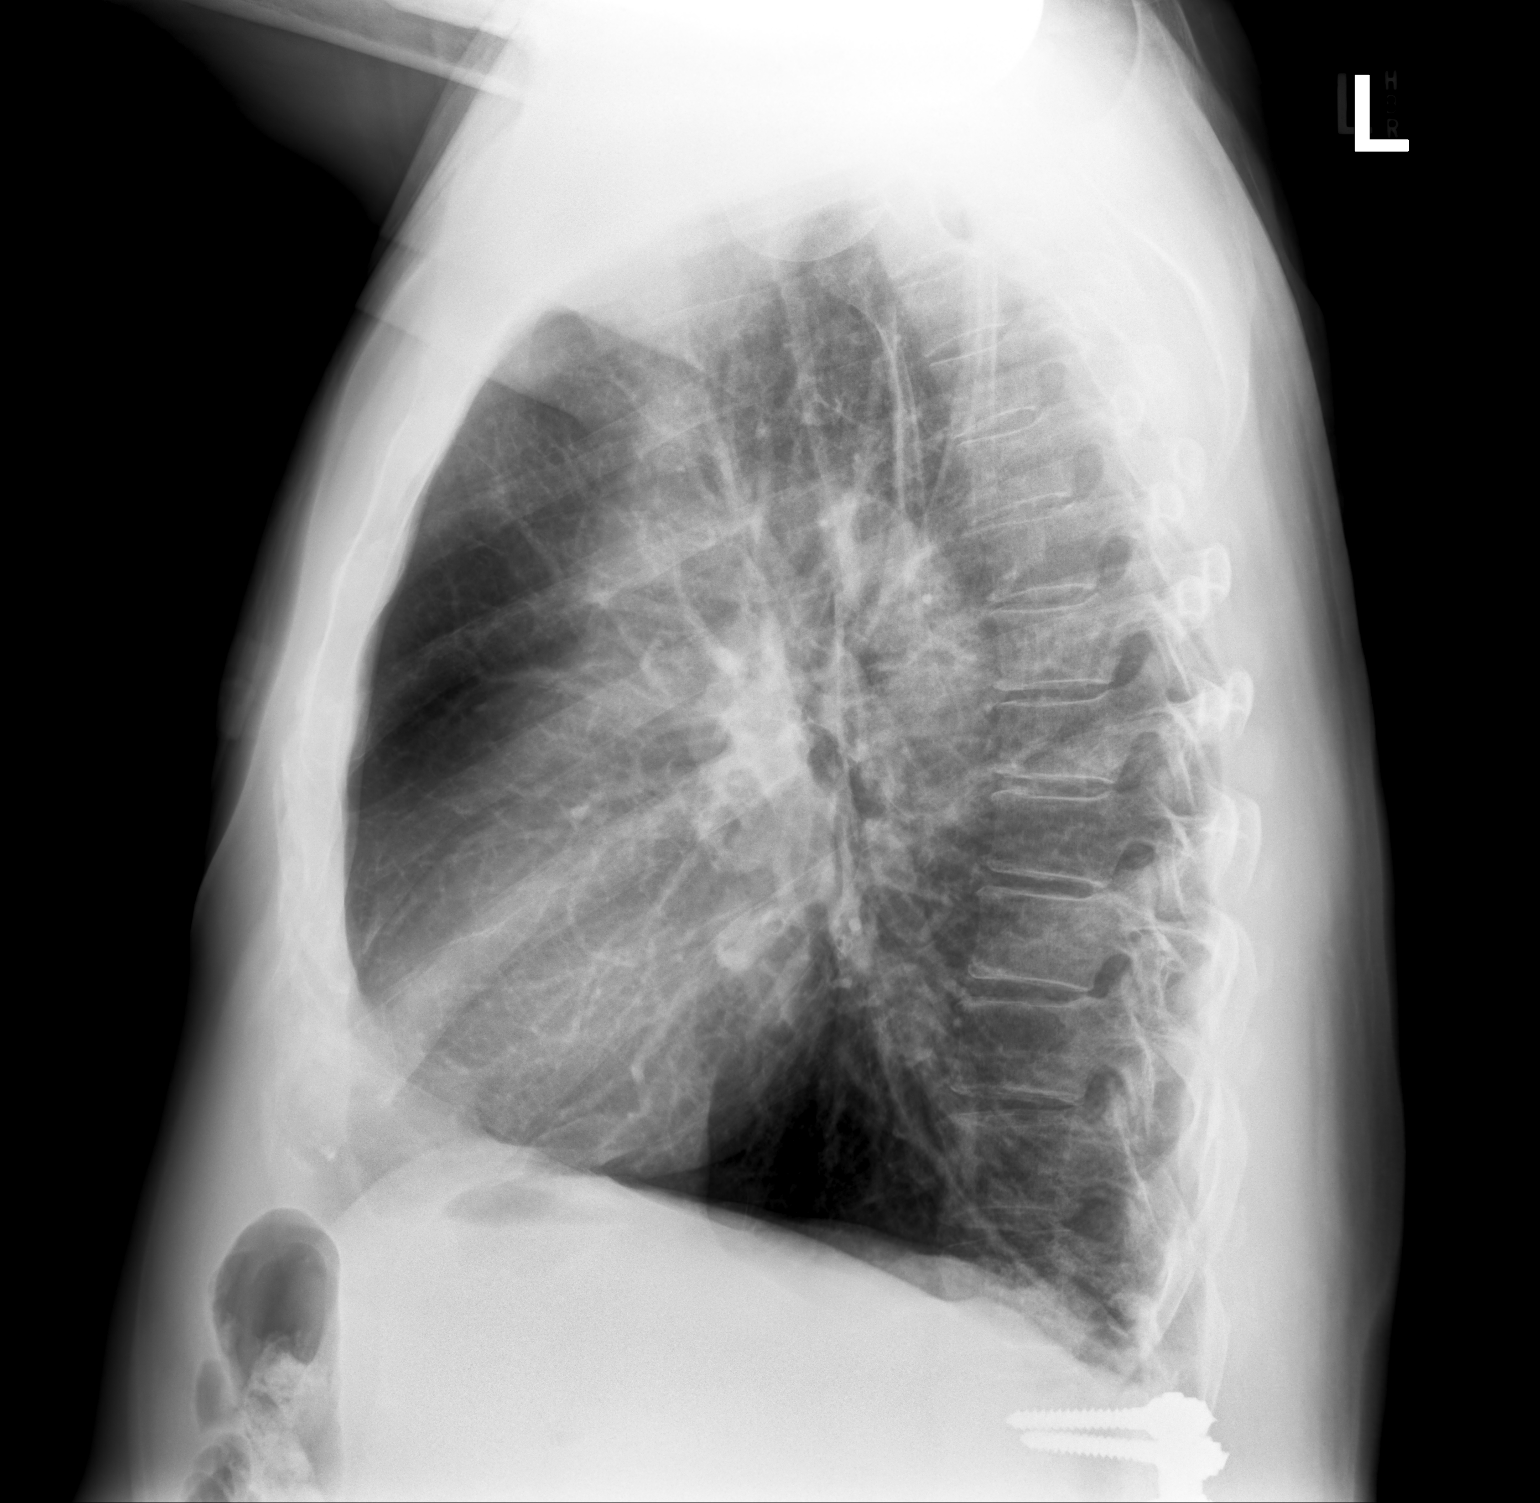

[2 of 2 positions shown; findings below may reference images not displayed]

FINDINGS: Normal heart size, mediastinal contours, and pulmonary vascularity.

Emphysematous and bronchitic changes consistent with COPD.

No pulmonary infiltrate, pleural effusion or pneumothorax.

Prior lumbar fusion and RIGHT shoulder arthroplasty.

Osseous demineralization.
IMPRESSION: COPD changes without acute abnormalities.

## 2021-10-27 ENCOUNTER — Ambulatory Visit: Payer: Medicaid Other | Admitting: Pulmonary Disease

## 2021-11-16 ENCOUNTER — Ambulatory Visit: Payer: Medicaid Other | Admitting: Pulmonary Disease

## 2021-12-22 ENCOUNTER — Ambulatory Visit: Payer: Medicaid Other | Admitting: Pulmonary Disease

## 2022-04-04 ENCOUNTER — Inpatient Hospital Stay: Payer: Medicaid Other | Admitting: Pulmonary Disease

## 2022-05-13 ENCOUNTER — Inpatient Hospital Stay: Payer: Medicaid Other | Admitting: Pulmonary Disease

## 2023-06-03 ENCOUNTER — Inpatient Hospital Stay (HOSPITAL_COMMUNITY)
Admission: EM | Admit: 2023-06-03 | Discharge: 2023-06-08 | DRG: 193 | Disposition: A | Discharge status: Home / Self Care | Payer: 59 | Attending: Internal Medicine | Admitting: Internal Medicine

## 2023-06-03 ENCOUNTER — Other Ambulatory Visit: Payer: Self-pay

## 2023-06-03 ENCOUNTER — Emergency Department (HOSPITAL_COMMUNITY): Payer: 59

## 2023-06-03 ENCOUNTER — Encounter (HOSPITAL_COMMUNITY): Payer: Self-pay | Admitting: *Deleted

## 2023-06-03 DIAGNOSIS — Z79899 Other long term (current) drug therapy: Secondary | ICD-10-CM | POA: Diagnosis not present

## 2023-06-03 DIAGNOSIS — J101 Influenza due to other identified influenza virus with other respiratory manifestations: Principal | ICD-10-CM | POA: Diagnosis present

## 2023-06-03 DIAGNOSIS — Z87891 Personal history of nicotine dependence: Secondary | ICD-10-CM | POA: Diagnosis not present

## 2023-06-03 DIAGNOSIS — Z1152 Encounter for screening for COVID-19: Secondary | ICD-10-CM | POA: Diagnosis not present

## 2023-06-03 DIAGNOSIS — J9621 Acute and chronic respiratory failure with hypoxia: Secondary | ICD-10-CM | POA: Diagnosis present

## 2023-06-03 DIAGNOSIS — J09X1 Influenza due to identified novel influenza A virus with pneumonia: Secondary | ICD-10-CM | POA: Diagnosis present

## 2023-06-03 DIAGNOSIS — F419 Anxiety disorder, unspecified: Secondary | ICD-10-CM | POA: Diagnosis present

## 2023-06-03 DIAGNOSIS — R079 Chest pain, unspecified: Secondary | ICD-10-CM | POA: Diagnosis not present

## 2023-06-03 DIAGNOSIS — J440 Chronic obstructive pulmonary disease with acute lower respiratory infection: Secondary | ICD-10-CM | POA: Diagnosis present

## 2023-06-03 DIAGNOSIS — Z7952 Long term (current) use of systemic steroids: Secondary | ICD-10-CM

## 2023-06-03 DIAGNOSIS — E875 Hyperkalemia: Secondary | ICD-10-CM | POA: Diagnosis present

## 2023-06-03 DIAGNOSIS — R000 Tachycardia, unspecified: Secondary | ICD-10-CM | POA: Diagnosis present

## 2023-06-03 DIAGNOSIS — J441 Chronic obstructive pulmonary disease with (acute) exacerbation: Principal | ICD-10-CM | POA: Diagnosis present

## 2023-06-03 DIAGNOSIS — I5032 Chronic diastolic (congestive) heart failure: Secondary | ICD-10-CM | POA: Diagnosis present

## 2023-06-03 DIAGNOSIS — G8929 Other chronic pain: Secondary | ICD-10-CM | POA: Diagnosis present

## 2023-06-03 DIAGNOSIS — Z9981 Dependence on supplemental oxygen: Secondary | ICD-10-CM | POA: Diagnosis not present

## 2023-06-03 LAB — COMPREHENSIVE METABOLIC PANEL
ALT: 19 U/L (ref 0–44)
AST: 36 U/L (ref 15–41)
Albumin: 3.9 g/dL (ref 3.5–5.0)
Alkaline Phosphatase: 65 U/L (ref 38–126)
Anion gap: 14 (ref 5–15)
BUN: 7 mg/dL — ABNORMAL LOW (ref 8–23)
CO2: 25 mmol/L (ref 22–32)
Calcium: 9.1 mg/dL (ref 8.9–10.3)
Chloride: 96 mmol/L — ABNORMAL LOW (ref 98–111)
Creatinine, Ser: 0.87 mg/dL (ref 0.61–1.24)
GFR, Estimated: >60 mL/min (ref >60)
Glucose, Bld: 100 mg/dL — ABNORMAL HIGH (ref 70–99)
Potassium: 5.8 mmol/L — ABNORMAL HIGH (ref 3.5–5.1)
Sodium: 135 mmol/L (ref 135–145)
Total Bilirubin: 1.4 mg/dL — ABNORMAL HIGH (ref 0.0–1.2)
Total Protein: 7.2 g/dL (ref 6.5–8.1)

## 2023-06-03 LAB — I-STAT VENOUS BLOOD GAS, ED
Acid-Base Excess: 6 mmol/L — ABNORMAL HIGH (ref 0.0–2.0)
Bicarbonate: 30.2 mmol/L — ABNORMAL HIGH (ref 20.0–28.0)
Calcium, Ion: 1.1 mmol/L — ABNORMAL LOW (ref 1.15–1.40)
HCT: 40 % (ref 39.0–52.0)
Hemoglobin: 13.6 g/dL (ref 13.0–17.0)
O2 Saturation: 99 %
Potassium: 4.1 mmol/L (ref 3.5–5.1)
Sodium: 134 mmol/L — ABNORMAL LOW (ref 135–145)
TCO2: 31 mmol/L (ref 22–32)
pCO2, Ven: 42.1 mm[Hg] — ABNORMAL LOW (ref 44–60)
pH, Ven: 7.463 — ABNORMAL HIGH (ref 7.25–7.43)
pO2, Ven: 156 mm[Hg] — ABNORMAL HIGH (ref 32–45)

## 2023-06-03 LAB — MAGNESIUM: Magnesium: 1.9 mg/dL (ref 1.7–2.4)

## 2023-06-03 LAB — CBC WITH DIFFERENTIAL/PLATELET
Abs Immature Granulocytes: 0.06 10*3/uL (ref 0.00–0.07)
Basophils Absolute: 0.1 10*3/uL (ref 0.0–0.1)
Basophils Relative: 1 %
Eosinophils Absolute: 0 10*3/uL (ref 0.0–0.5)
Eosinophils Relative: 1 %
HCT: 46.6 % (ref 39.0–52.0)
Hemoglobin: 15 g/dL (ref 13.0–17.0)
Immature Granulocytes: 1 %
Lymphocytes Relative: 7 %
Lymphs Abs: 0.6 10*3/uL — ABNORMAL LOW (ref 0.7–4.0)
MCH: 30.1 pg (ref 26.0–34.0)
MCHC: 32.2 g/dL (ref 30.0–36.0)
MCV: 93.4 fL (ref 80.0–100.0)
Monocytes Absolute: 0.9 10*3/uL (ref 0.1–1.0)
Monocytes Relative: 10 %
Neutro Abs: 6.9 10*3/uL (ref 1.7–7.7)
Neutrophils Relative %: 80 %
Platelets: 156 10*3/uL (ref 150–400)
RBC: 4.99 MIL/uL (ref 4.22–5.81)
RDW: 13 % (ref 11.5–15.5)
WBC: 8.5 10*3/uL (ref 4.0–10.5)
nRBC: 0 % (ref 0.0–0.2)

## 2023-06-03 LAB — RESP PANEL BY RT-PCR (RSV, FLU A&B, COVID)  RVPGX2
Influenza A by PCR: POSITIVE — AB
Influenza B by PCR: NEGATIVE
Resp Syncytial Virus by PCR: NEGATIVE
SARS Coronavirus 2 by RT PCR: NEGATIVE

## 2023-06-03 LAB — PHOSPHORUS: Phosphorus: 2.3 mg/dL — ABNORMAL LOW (ref 2.5–4.6)

## 2023-06-03 LAB — BASIC METABOLIC PANEL
Anion gap: 14 (ref 5–15)
BUN: 6 mg/dL — ABNORMAL LOW (ref 8–23)
CO2: 23 mmol/L (ref 22–32)
Calcium: 8.8 mg/dL — ABNORMAL LOW (ref 8.9–10.3)
Chloride: 98 mmol/L (ref 98–111)
Creatinine, Ser: 0.74 mg/dL (ref 0.61–1.24)
GFR, Estimated: >60 mL/min (ref >60)
Glucose, Bld: 112 mg/dL — ABNORMAL HIGH (ref 70–99)
Potassium: 4.2 mmol/L (ref 3.5–5.1)
Sodium: 135 mmol/L (ref 135–145)

## 2023-06-03 LAB — TROPONIN I (HIGH SENSITIVITY): Troponin I (High Sensitivity): 6 ng/L (ref <18)

## 2023-06-03 LAB — LACTIC ACID, PLASMA: Lactic Acid, Venous: 1.5 mmol/L (ref 0.5–1.9)

## 2023-06-03 LAB — CK: Total CK: 156 U/L (ref 49–397)

## 2023-06-03 LAB — I-STAT CG4 LACTIC ACID, ED
Lactic Acid, Venous: 2.2 mmol/L (ref 0.5–1.9)
Lactic Acid, Venous: 1.8 mmol/L (ref 0.5–1.9)

## 2023-06-03 LAB — PROCALCITONIN: Procalcitonin: <0.1 ng/mL

## 2023-06-03 MED ORDER — IPRATROPIUM-ALBUTEROL 0.5-2.5 (3) MG/3ML IN SOLN
3.0000 mL | Freq: Four times a day (QID) | RESPIRATORY_TRACT | Status: DC
  Administered 2023-06-03 – 2023-06-07 (×14): 3 mL via RESPIRATORY_TRACT
  Filled 2023-06-03 (×13): qty 3

## 2023-06-03 MED ORDER — ACETAMINOPHEN 500 MG PO TABS
1000.0000 mg | ORAL_TABLET | Freq: Once | ORAL | Status: AC
  Administered 2023-06-03: 1000 mg via ORAL
  Filled 2023-06-03: qty 2

## 2023-06-03 MED ORDER — LACTATED RINGERS IV BOLUS
500.0000 mL | Freq: Once | INTRAVENOUS | Status: AC
  Administered 2023-06-03: 500 mL via INTRAVENOUS

## 2023-06-03 MED ORDER — PREDNISONE 20 MG PO TABS: 40.0000 mg | ORAL_TABLET | Freq: Every day | ORAL | Status: DC

## 2023-06-03 MED ORDER — HYDROCODONE-ACETAMINOPHEN 5-325 MG PO TABS
1.0000 | ORAL_TABLET | ORAL | Status: DC | PRN
  Administered 2023-06-04 – 2023-06-07 (×4): 2 via ORAL
  Filled 2023-06-03 (×4): qty 2

## 2023-06-03 MED ORDER — ACETAMINOPHEN 650 MG RE SUPP: 650.0000 mg | Freq: Four times a day (QID) | RECTAL | Status: DC | PRN

## 2023-06-03 MED ORDER — OSELTAMIVIR PHOSPHATE 75 MG PO CAPS
75.0000 mg | ORAL_CAPSULE | Freq: Two times a day (BID) | ORAL | Status: AC
  Administered 2023-06-03 – 2023-06-08 (×10): 75 mg via ORAL
  Filled 2023-06-03 (×10): qty 1

## 2023-06-03 MED ORDER — ACETAMINOPHEN 325 MG PO TABS
650.0000 mg | ORAL_TABLET | Freq: Four times a day (QID) | ORAL | Status: DC | PRN
  Administered 2023-06-05 – 2023-06-06 (×2): 650 mg via ORAL
  Filled 2023-06-03 (×2): qty 2

## 2023-06-03 MED ORDER — METHYLPREDNISOLONE SODIUM SUCC 125 MG IJ SOLR
80.0000 mg | INTRAMUSCULAR | Status: AC
  Administered 2023-06-04: 80 mg via INTRAVENOUS
  Filled 2023-06-03: qty 2

## 2023-06-03 MED ORDER — PREDNISONE 20 MG PO TABS
40.0000 mg | ORAL_TABLET | Freq: Every day | ORAL | Status: DC
  Administered 2023-06-05 – 2023-06-06 (×2): 40 mg via ORAL
  Filled 2023-06-03 (×2): qty 2

## 2023-06-03 MED ORDER — IPRATROPIUM BROMIDE 0.02 % IN SOLN
0.5000 mg | Freq: Once | RESPIRATORY_TRACT | Status: AC
  Administered 2023-06-03: 0.5 mg via RESPIRATORY_TRACT
  Filled 2023-06-03: qty 2.5

## 2023-06-03 MED ORDER — GUAIFENESIN ER 600 MG PO TB12
600.0000 mg | ORAL_TABLET | Freq: Two times a day (BID) | ORAL | Status: DC
  Administered 2023-06-03 – 2023-06-08 (×10): 600 mg via ORAL
  Filled 2023-06-03 (×10): qty 1

## 2023-06-03 MED ORDER — METHYLPREDNISOLONE SODIUM SUCC 125 MG IJ SOLR
125.0000 mg | Freq: Once | INTRAMUSCULAR | Status: AC
  Administered 2023-06-03: 125 mg via INTRAVENOUS
  Filled 2023-06-03: qty 2

## 2023-06-03 MED ORDER — SODIUM CHLORIDE 0.9 % IV SOLN: INTRAVENOUS | Status: AC

## 2023-06-03 MED ORDER — SODIUM PHOSPHATES 45 MMOLE/15ML IV SOLN
15.0000 mmol | Freq: Once | INTRAVENOUS | Status: AC
  Administered 2023-06-04: 15 mmol via INTRAVENOUS
  Filled 2023-06-03: qty 5

## 2023-06-03 MED ORDER — ALBUTEROL SULFATE (2.5 MG/3ML) 0.083% IN NEBU
5.0000 mg | INHALATION_SOLUTION | Freq: Once | RESPIRATORY_TRACT | Status: AC
  Administered 2023-06-03: 5 mg via RESPIRATORY_TRACT
  Filled 2023-06-03: qty 6

## 2023-06-03 MED ORDER — ONDANSETRON HCL 4 MG PO TABS
4.0000 mg | ORAL_TABLET | Freq: Four times a day (QID) | ORAL | Status: DC | PRN
  Administered 2023-06-06 – 2023-06-07 (×2): 4 mg via ORAL
  Filled 2023-06-03 (×2): qty 1

## 2023-06-03 MED ORDER — METHYLPREDNISOLONE SODIUM SUCC 40 MG IJ SOLR: 40.0000 mg | Freq: Two times a day (BID) | INTRAMUSCULAR | Status: DC

## 2023-06-03 MED ORDER — ALBUTEROL SULFATE (2.5 MG/3ML) 0.083% IN NEBU
2.5000 mg | INHALATION_SOLUTION | RESPIRATORY_TRACT | Status: DC | PRN
  Administered 2023-06-05: 2.5 mg via RESPIRATORY_TRACT
  Filled 2023-06-03: qty 3

## 2023-06-03 MED ORDER — ONDANSETRON HCL 4 MG/2ML IJ SOLN: 4.0000 mg | Freq: Four times a day (QID) | INTRAMUSCULAR | Status: DC | PRN

## 2023-06-03 MED ORDER — SODIUM CHLORIDE 0.9 % IV SOLN
1.0000 g | INTRAVENOUS | Status: DC
  Administered 2023-06-03 – 2023-06-04 (×2): 1 g via INTRAVENOUS
  Filled 2023-06-03 (×2): qty 10

## 2023-06-03 NOTE — H&P (Signed)
Chris [name] GNF:[id number] DOB: 1955/09/10 DOA: 06/03/2023     PCP: [facility]   Outpatient Specialists:    Pulmonary    Dr. Isaiah Serge  Patient arrived to ER on 06/03/23 at 1806 Referred by Attending Gwyneth Sprout, MD   Patient coming from:    home Lives  With SO     Chief Complaint:   Chief Complaint  Patient presents with   Shortness of Breath    HPI: Chris [name] is a 68 y.o. male with medical history significant of COPD, chronic hypoxia on 3-4 L, anxiety    Presented with worsening shortness of breath Patient history of COPD usually on oxygen at 3 to 4 L at home came in 3 days of URI symptoms myalgia malaise fevers cough shortness of breath but has been progressively getting worse try to use home breathing treatments but does not seem to improve now having trouble shortness of breath no chest pain no leg swelling when EMS arrived noted to be satting 85% on his baseline 3 to 4 L and started on DuoNeb with some improvement In ER tested positive for flu A    Denies significant ETOH intake   Does not smoke   Lab Results  Component Value Date   SARSCOV2NAA NEGATIVE 06/03/2023        Regarding pertinent Chronic problems:                    COPD -  followed by pulmonology   on baseline oxygen  3-4L,     While in ER:    Positive for Influenza A    Lab Orders         Blood Culture (routine x 2)         Resp panel by RT-PCR (RSV, Flu A&B, Covid) Anterior Nasal Swab         Comprehensive metabolic panel         CBC with Differential         Protime-INR         APTT         I-Stat Lactic Acid, ED       CXR - New multifocal nodular opacity within the left upper lobe, likely infectious or inflammatory in nature    Following Medications were ordered in ER: Medications  acetaminophen (TYLENOL) tablet 1,000 mg (has no administration in time range)  methylPREDNISolone sodium succinate (SOLU-MEDROL) 125 mg/2 mL injection 125 mg (125 mg  Intravenous Given 06/03/23 1957)  albuterol (PROVENTIL) (2.5 MG/3ML) 0.083% nebulizer solution 5 mg (5 mg Nebulization Given 06/03/23 1956)  ipratropium (ATROVENT) nebulizer solution 0.5 mg (0.5 mg Nebulization Given 06/03/23 1957)  lactated ringers bolus 500 mL (0 mLs Intravenous Stopped 06/03/23 2037)       ED Triage Vitals  Encounter Vitals Group     BP 06/03/23 1815 123/71     Systolic BP Percentile --      Diastolic BP Percentile --      Pulse Rate 06/03/23 1815 92     Resp 06/03/23 1815 (!) 28     Temp 06/03/23 1815 98.8 F (37.1 C)     Temp Source 06/03/23 1815 Oral     SpO2 06/03/23 1815 94 %     Weight 06/03/23 1816 185 lb 13.6 oz (84.3 kg)     Height 06/03/23 1816 5\' 9"  (1.753 m)     Head Circumference --      Peak Flow --  Pain Score 06/03/23 1816 7     Pain Loc --      Pain Education --      Exclude from Growth Chart --   UEAV(40)@     _________________________________________ Significant initial  Findings: Abnormal Labs Reviewed  RESP PANEL BY RT-PCR (RSV, FLU A&B, COVID)  RVPGX2 - Abnormal; Notable for the following components:      Result Value   Influenza A by PCR POSITIVE (*)    All other components within normal limits  COMPREHENSIVE METABOLIC PANEL - Abnormal; Notable for the following components:   Potassium 5.8 (*)    Chloride 96 (*)    Glucose, Bld 100 (*)    BUN 7 (*)    Total Bilirubin 1.4 (*)    All other components within normal limits  CBC WITH DIFFERENTIAL/PLATELET - Abnormal; Notable for the following components:   Lymphs Abs 0.6 (*)    All other components within normal limits  I-STAT CG4 LACTIC ACID, ED - Abnormal; Notable for the following components:   Lactic Acid, Venous 2.2 (*)    All other components within normal limits        ECG: Ordered Personally reviewed and interpreted by me showing: HR : 106 Rhythm:Sinus tachycardia with Premature supraventricular complexes and with occasional Premature ventricular complexes Left axis  deviation Anterior infarct , age undetermined No significant change since last tracing QTC 438  BNP (last 3 results) No results for input(s): "BNP" in the last 8760 hours.   COVID-19 Labs  No results for input(s): "DDIMER", "FERRITIN", "LDH", "CRP" in the last 72 hours.  Lab Results  Component Value Date   SARSCOV2NAA NEGATIVE 06/03/2023     The recent clinical data is shown below. Vitals:   06/03/23 1815 06/03/23 1816  BP: 123/71   Pulse: 92   Resp: (!) 28   Temp: 98.8 F (37.1 C)   TempSrc: Oral   SpO2: 94%   Weight:  84.3 kg  Height:  5\' 9"  (1.753 m)    WBC     Component Value Date/Time   WBC 8.5 06/03/2023 1828   LYMPHSABS 0.6 (L) 06/03/2023 1828   MONOABS 0.9 06/03/2023 1828   EOSABS 0.0 06/03/2023 1828   BASOSABS 0.1 06/03/2023 1828     Lactic Acid, Venous    Component Value Date/Time   LATICACIDVEN 2.2 (HH) 06/03/2023 1903     Lactic Acid, Venous    Component Value Date/Time   LATICACIDVEN 1.8 06/03/2023 2136    Procalcitonin   Ordered      Results for orders placed or performed during the hospital encounter of 06/03/23  Resp panel by RT-PCR (RSV, Flu A&B, Covid) Anterior Nasal Swab     Status: Abnormal   Collection Time: 06/03/23  6:29 PM   Specimen: Anterior Nasal Swab  Result Value Ref Range Status   SARS Coronavirus 2 by RT PCR NEGATIVE NEGATIVE Final   Influenza A by PCR POSITIVE (A) NEGATIVE Final   Influenza B by PCR NEGATIVE NEGATIVE Final         Resp Syncytial Virus by PCR NEGATIVE NEGATIVE Final          Venous  Blood Gas result:  pH   7.463 High  Sodium 134 Low  mmol/L   pCO2, Ven 42.1 Low  mmHg Potassium 4.1 mmol/L  pO2, Ven 156 High  mmHg      ABG    Component Value Date/Time   TCO2 23 08/12/2015 1426    ___________________________________________________ Recent  Labs  Lab 06/03/23 1828 06/03/23 2140  NA 135 134*  K 5.8* 4.1  CO2 25  --   GLUCOSE 100*  --   BUN 7*  --   CREATININE 0.87  --   CALCIUM 9.1  --      Cr   stable  Lab Results  Component Value Date   CREATININE 0.87 06/03/2023   CREATININE 0.80 05/14/2019   CREATININE 0.82 08/12/2015    Recent Labs  Lab 06/03/23 1828  AST 36  ALT 19  ALKPHOS 65  BILITOT 1.4*  PROT 7.2  ALBUMIN 3.9   Lab Results  Component Value Date   CALCIUM 9.1 06/03/2023    Plt: Lab Results  Component Value Date   PLT 156 06/03/2023       Recent Labs  Lab 06/03/23 1828  WBC 8.5  NEUTROABS 6.9  HGB 15.0  HCT 46.6  MCV 93.4  PLT 156    HG/HCT   stable,       Component Value Date/Time   HGB 15.0 06/03/2023 1828   HCT 46.6 06/03/2023 1828   MCV 93.4 06/03/2023 1828    _______________________________________________ Hospitalist was called for admission for   COPD exacerbation (HCC)    Influenza A   The following Work up has been ordered so far:  Orders Placed This Encounter  Procedures   Blood Culture (routine x 2)   Resp panel by RT-PCR (RSV, Flu A&B, Covid) Anterior Nasal Swab   DG Chest Port 1 View   Comprehensive metabolic panel   CBC with Differential   Protime-INR   APTT   Document height and weight   Assess and Document Glasgow Coma Scale   Document vital signs within 1-hour of fluid bolus completion.  Notify provider of abnormal vital signs despite fluid resuscitation.   Refer to Sidebar Report: Sepsis Bundle ED/IP   Notify provider for difficulties obtaining IV access   Initiate Carrier Fluid Protocol   Consult for Unassigned Medical Admission   I-Stat Lactic Acid, ED   EKG 12-Lead   ED EKG   Insert peripheral IV X 1     OTHER Significant initial  Findings:  labs showing:     DM  labs:  HbA1C: No results for input(s): "HGBA1C" in the last 8760 hours.     CBG (last 3)  No results for input(s): "GLUCAP" in the last 72 hours.        Cultures: No results found for: "SDES", "SPECREQUEST", "CULT", "REPTSTATUS"   Radiological Exams on Admission: DG Chest Port 1 View Result Date: 06/03/2023 CLINICAL  DATA:  Sepsis EXAM: PORTABLE CHEST 1 VIEW COMPARISON:  03/23/2023, CT 05/31/2022 FINDINGS: Focal opacity at the right cardiophrenic angle relates to prominent epicardial fat better seen on prior CT examination. The lungs are symmetrically well expanded. There has developed progressive interstitial thickening which may relate to developing airway inflammation. Moreover, there is new multifocal nodular opacity within the left upper lobe, likely infectious or inflammatory in nature. No pneumothorax or pleural effusion. Cardiac size within normal limits. Pulmonary vascularity is normal. No acute bone. IMPRESSION: 1. Progressive interstitial thickening which may relate to developing airway inflammation. 2. New multifocal nodular opacity within the left upper lobe, likely infectious or inflammatory in nature. A follow-up two view chest radiograph is recommended in 3-4 weeks to document resolution. Electronically Signed   By: Helyn Numbers M.D.   On: 06/03/2023 19:18   _______________________________________________________________________________________________________ Latest  Blood pressure 123/71, pulse 92, temperature 98.8 F (37.1 C),  temperature source Oral, resp. rate (!) 28, height 5\' 9"  (1.753 m), weight 84.3 kg, SpO2 94%.   Vitals  labs and radiology finding personally reviewed  Review of Systems:    Pertinent positives include:  Fevers, chills, fatigue,   dyspnea on exertion, excess mucus,productive cough, Constitutional:  No weight loss, night sweats,weight loss  HEENT:  No headaches, Difficulty swallowing,Tooth/dental problems,Sore throat,  No sneezing, itching, ear ache, nasal congestion, post nasal drip,  Cardio-vascular:  No chest pain, Orthopnea, PND, anasarca, dizziness, palpitations.no Bilateral lower extremity swelling  GI:  No heartburn, indigestion, abdominal pain, nausea, vomiting, diarrhea, change in bowel habits, loss of appetite, melena, blood in stool, hematemesis Resp:  no  shortness of breath at rest  No non-productive cough, No coughing up of blood.No change in color of mucus.No wheezing. Skin:  no rash or lesions. No jaundice GU:  no dysuria, change in color of urine, no urgency or frequency. No straining to urinate.  No flank pain.  Musculoskeletal:  No joint pain or no joint swelling. No decreased range of motion. No back pain.  Psych:  No change in mood or affect. No depression or anxiety. No memory loss.  Neuro: no localizing neurological complaints, no tingling, no weakness, no double vision, no gait abnormality, no slurred speech, no confusion  All systems reviewed and apart from HOPI all are negative _______________________________________________________________________________________________ Past Medical History:   Past Medical History:  Diagnosis Date   COPD (chronic obstructive pulmonary disease) (HCC)       Past Surgical History:  Procedure Laterality Date   arm surgery      Social History:  Ambulatory   independently      reports that he has quit smoking. He has never used smokeless tobacco. He reports current alcohol use. He reports that he does not use drugs.    Family History:   History reviewed. No pertinent family history. ______________________________________________________________________________________________ Allergies: No Known Allergies   Prior to Admission medications   Medication Sig Start Date End Date Taking? Authorizing Provider  albuterol (VENTOLIN HFA) 108 (90 Base) MCG/ACT inhaler INHALE 2 PUFFS BY MOUTH EVERY 4 6 HOURS AS NEEDED 05/09/19   [provider]  ALPRAZolam Prudy Feeler) 0.5 MG tablet Take 0.5 mg by mouth 2 (two) times daily.    [provider]  bacitracin ointment Apply 1 application topically 3 (three) times daily as needed for wound care. Patient not taking: Reported on 09/12/2019 05/15/19   Virgina Norfolk, DO  Buprenorphine HCl-Naloxone HCl 8-2 MG FILM Place under the tongue.     [provider]  cyclobenzaprine (FLEXERIL) 10 MG tablet Take 10 mg by mouth 3 (three) times daily as needed.    [provider]  erythromycin ophthalmic ointment Place 1 application into the right eye every 6 (six) hours. Place 1/2 inch ribbon of ointment in the affected eye 4 times a day for 5 days Patient not taking: Reported on 09/12/2019 08/12/15   Little, Ambrose Finland, MD  predniSONE (DELTASONE) 10 MG tablet 40mg  daily x5 days then 10mg  daily. 09/12/19   Mannam, Colbert Coyer, MD  venlafaxine XR (EFFEXOR XR) 37.5 MG 24 hr capsule Take one capsule daily for 1 week and than twice daily 10/08/18   Cleotis Nipper, MD    ___________________________________________________________________________________________________ Physical Exam:    06/03/2023    6:16 PM 06/03/2023    6:15 PM 09/12/2019    3:04 PM  Vitals with BMI  Height 5\' 9"   5\' 9"   Weight 185 lbs 14 oz  185 lbs 14 oz  BMI 27.43  27.44  Systolic  123 130  Diastolic  71 62  Pulse  92 71     1. General:  in No  Acute distress   Chronically ill   -appearing 2. Psychological: Alert and   Oriented 3. Head/ENT:   Dry Mucous Membranes                          Head Non traumatic, neck supple                          Poor Dentition 4. SKIN:  decreased Skin turgor,  Skin clean Dry and intact no rash    5. Heart: Regular rate and rhythm no  Murmur, no Rub or gallop 6. Lungs some  wheezes or crackles   7. Abdomen: Soft,  non-tender, Non distended  bowel sounds present 8. Lower extremities: no clubbing, cyanosis, no  edema 9. Neurologically Grossly intact, moving all 4 extremities equally  10. MSK: Normal range of motion    Chart has been reviewed  ______________________________________________________________________________________________  Assessment/Plan  68 y.o. male with medical history significant of COPD, chronic hypoxia on 3-4 L, anxiety  Admitted for   COPD exacerbation   Influenza A     Present on  Admission:  COPD exacerbation (HCC)  Influenza A with pneumonia  Acute on chronic respiratory failure with hypoxia (HCC)  Chest pain     COPD exacerbation (HCC)  -  - Will initiate: Steroid taper  -  Antibiotics rocephin - Albuterol  PRN, - scheduled duoneb,  -  Breo or Dulera at discharge   -  Mucinex.  Titrate O2 to saturation >90%. Follow patients respiratory status.  influenza PCR positive   VBG ordered    Currently mentating well no evidence of symptomatic hypercarbia   Influenza A with pneumonia Treat with Tamiflu Treat underlying COPD exacerbation Check procalcitonin  Acute on chronic respiratory failure with hypoxia (HCC)  this patient has acute respiratory failure with Hypoxia  as documented by the presence of following: O2 saturatio< 90% on 3-4 L Likely due t , COPD exacerbation, influenza  pneumonia Provide O2 therapy and titrate as needed  Continuous pulse ox   check Pulse ox with ambulation prior to discharge     flutter valve ordered   Chest pain Patient endorses chest pain is worse with coughing and deep breaths. Cycle troponin obtain CTA that should also help determine if there is a presence of pneumonia Patient probably would benefit from echogram in a.m.    Other plan as per orders.  DVT prophylaxis:  SCD     Code Status:    Code Status: Not on file FULL CODE  as per patient   I had personally discussed CODE STATUS with patient   ACP   none    Family Communication:   Family not at  Bedside    Diet hert healthy   Disposition Plan:     To home once workup is complete and patient is stable   Following barriers for discharge:                                                         Electrolytes corrected  able to transition to PO antibiotics                            COPD stable      Consults called: none     Admission status:  ED Disposition     ED Disposition  Admit   Condition  --   Comment   Hospital Area: [facility] [[id number]]  Level of Care: Progressive [102]  Admit to Progressive based on following criteria: RESPIRATORY PROBLEMS hypoxemic/hypercapnic respiratory failure that is responsive to NIPPV (BiPAP) or High Flow Nasal Cannula (6-80 lpm). Frequent assessment/intervention, no > Q2 hrs < Q4 hrs, to maintain oxygenation and pulmonary hygiene.  May admit patient to [facility] or [facility] if equivalent level of care is available:: No  Covid Evaluation: Asymptomatic - no recent exposure (last 10 days) testing not required  Diagnosis: COPD exacerbation [facility]) [[id number]]  Admitting Physician: Therisa Doyne [[id number]]  Attending Physician: Therisa Doyne [[id number]]  Certification:: I certify this patient will need inpatient services for at least 2 midnights  Expected Medical Readiness: 06/08/2023           inpatient     I Expect 2 midnight stay secondary to severity of patient's current illness need for inpatient interventions justified by the following:  hemodynamic instability despite optimal treatment (tachycardia   tachypnea  hypoxia, )   and extensive comorbidities including:  COPD/asthma  That are currently affecting medical management.   I expect  patient to be hospitalized for 2 midnights requiring inpatient medical care.  Patient is at high risk for adverse outcome (such as loss of life or disability) if not treated.  Indication for inpatient stay as follows:    New or worsening hypoxia   Need for IV antibiotics, IV fluids,    Level of care       progressive      tele indefinitely please discontinue once patient no longer qualifies COVID-19 Labs   Lab Results  Component Value Date   SARSCOV2NAA NEGATIVE 06/03/2023      Precautions: admitted as   Covid Negative    Chris [name] 06/03/2023, 10:18 PM    [facility]     after 2 AM please page floor coverage PA If 7AM-7PM, please contact the day team taking care of the  patient using [email or web address]

## 2023-06-03 NOTE — ED Notes (Signed)
Missed IV attempt while collected lab specimens x2 by this RN.

## 2023-06-03 NOTE — Assessment & Plan Note (Signed)
this patient has acute respiratory failure with Hypoxia  as documented by the presence of following: O2 saturatio< 90% on 3-4 L Likely due t , COPD exacerbation, influenza  pneumonia Provide O2 therapy and titrate as needed  Continuous pulse ox   check Pulse ox with ambulation prior to discharge     flutter valve ordered

## 2023-06-03 NOTE — Assessment & Plan Note (Signed)
Patient endorses chest pain is worse with coughing and deep breaths. Cycle troponin obtain CTA that should also help determine if there is a presence of pneumonia Patient probably would benefit from echogram in a.m.

## 2023-06-03 NOTE — ED Provider Notes (Signed)
[place] EMERGENCY DEPARTMENT AT [facility] Provider Note   CSN: [id number] Arrival date & time: 06/03/23  1806     History  Chief Complaint  Patient presents with   Shortness of Breath    Chris [name] is a 68 y.o. male.  Patient is a 68 year old male with a history of COPD on chronic oxygen at home at 3 to 4 L who is presenting today by EMS for 3 days of URI symptoms including myalgias, malaise, fever, productive cough of green sputum and shortness of breath.  Patient reports that his symptoms have worsened over the last 24 hours and even despite using his breathing treatments at home he could not catch his breath.  His girlfriend has had some mild URI symptoms but reported is nothing like what he is experienced.  He denies any abdominal pain or chest pain.  No leg swelling.  EMS reported that patient was satting 85% upon their arrival which did improve after 1 DuoNeb to 96% on his home oxygen.  Patient is still currently complaining of being short of breath and having diffuse myalgias.  The history is provided by the patient.  Shortness of Breath      Home Medications Prior to Admission medications   Medication Sig Start Date End Date Taking? Authorizing Provider  albuterol (VENTOLIN HFA) 108 (90 Base) MCG/ACT inhaler INHALE 2 PUFFS BY MOUTH EVERY 4 6 HOURS AS NEEDED 05/09/19   [provider]  ALPRAZolam Prudy Feeler) 0.5 MG tablet Take 0.5 mg by mouth 2 (two) times daily.    [provider]  bacitracin ointment Apply 1 application topically 3 (three) times daily as needed for wound care. Patient not taking: Reported on 09/12/2019 05/15/19   Virgina Norfolk, DO  Buprenorphine HCl-Naloxone HCl 8-2 MG FILM Place under the tongue.    [provider]  cyclobenzaprine (FLEXERIL) 10 MG tablet Take 10 mg by mouth 3 (three) times daily as needed.    [provider]  erythromycin ophthalmic ointment Place 1 application into the right eye every 6  (six) hours. Place 1/2 inch ribbon of ointment in the affected eye 4 times a day for 5 days Patient not taking: Reported on 09/12/2019 08/12/15   Little, Ambrose Finland, MD  predniSONE (DELTASONE) 10 MG tablet 40mg  daily x5 days then 10mg  daily. 09/12/19   Mannam, Colbert Coyer, MD  venlafaxine XR (EFFEXOR XR) 37.5 MG 24 hr capsule Take one capsule daily for 1 week and than twice daily 10/08/18   Arfeen, Phillips Grout, MD      Allergies    Patient has no known allergies.    Review of Systems   Review of Systems  Respiratory:  Positive for shortness of breath.     Physical Exam Updated Vital Signs BP 123/71 (BP Location: Right Arm)   Pulse 92   Temp 98.8 F (37.1 C) (Oral)   Resp (!) 28   Ht 5\' 9"  (1.753 m)   Wt 84.3 kg   SpO2 94%   BMI 27.44 kg/m  Physical Exam Vitals and nursing note reviewed.  Constitutional:      General: He is not in acute distress.    Appearance: He is well-developed. He is ill-appearing.  HENT:     Head: Normocephalic and atraumatic.     Mouth/Throat:     Mouth: Mucous membranes are dry.  Eyes:     Conjunctiva/sclera: Conjunctivae normal.     Pupils: Pupils are equal, round, and reactive to light.  Cardiovascular:  Rate and Rhythm: Regular rhythm. Tachycardia present.     Heart sounds: No murmur heard. Pulmonary:     Effort: Pulmonary effort is normal. Tachypnea present. No respiratory distress.     Breath sounds: Wheezing present. No rales.  Abdominal:     General: There is no distension.     Palpations: Abdomen is soft.     Tenderness: There is no abdominal tenderness. There is no guarding or rebound.  Musculoskeletal:        General: No tenderness. Normal range of motion.     Cervical back: Normal range of motion and neck supple.     Right lower leg: No edema.     Left lower leg: No edema.  Skin:    General: Skin is warm and dry.     Findings: No erythema or rash.  Neurological:     Mental Status: He is alert and oriented to person, place, and time.  Mental status is at baseline.  Psychiatric:        Behavior: Behavior normal.     ED Results / Procedures / Treatments   Labs (all labs ordered are listed, but only abnormal results are displayed) Labs Reviewed  RESP PANEL BY RT-PCR (RSV, FLU A&B, COVID)  RVPGX2 - Abnormal; Notable for the following components:      Result Value   Influenza A by PCR POSITIVE (*)    All other components within normal limits  COMPREHENSIVE METABOLIC PANEL - Abnormal; Notable for the following components:   Potassium 5.8 (*)    Chloride 96 (*)    Glucose, Bld 100 (*)    BUN 7 (*)    Total Bilirubin 1.4 (*)    All other components within normal limits  CBC WITH DIFFERENTIAL/PLATELET - Abnormal; Notable for the following components:   Lymphs Abs 0.6 (*)    All other components within normal limits  I-STAT CG4 LACTIC ACID, ED - Abnormal; Notable for the following components:   Lactic Acid, Venous 2.2 (*)    All other components within normal limits  CULTURE, BLOOD (ROUTINE X 2)  CULTURE, BLOOD (ROUTINE X 2)  PROTIME-INR  APTT  I-STAT CG4 LACTIC ACID, ED    EKG EKG Interpretation Date/Time:  Saturday June 03 2023 18:10:30 EST Ventricular Rate:  106 PR Interval:  200 QRS Duration:  78 QT Interval:  330 QTC Calculation: 438 R Axis:   -61  Text Interpretation: Sinus tachycardia with Premature supraventricular complexes and with occasional Premature ventricular complexes Left axis deviation Anterior infarct , age undetermined No significant change since last tracing When compared with ECG of 14-May-2019 17:36, PREVIOUS ECG IS PRESENT Confirmed by Gwyneth Sprout ([id number]) on 06/03/2023 7:13:18 PM  Radiology DG Chest Port 1 View Result Date: 06/03/2023 CLINICAL DATA:  Sepsis EXAM: PORTABLE CHEST 1 VIEW COMPARISON:  03/23/2023, CT 05/31/2022 FINDINGS: Focal opacity at the right cardiophrenic angle relates to prominent epicardial fat better seen on prior CT examination. The lungs are symmetrically  well expanded. There has developed progressive interstitial thickening which may relate to developing airway inflammation. Moreover, there is new multifocal nodular opacity within the left upper lobe, likely infectious or inflammatory in nature. No pneumothorax or pleural effusion. Cardiac size within normal limits. Pulmonary vascularity is normal. No acute bone. IMPRESSION: 1. Progressive interstitial thickening which may relate to developing airway inflammation. 2. New multifocal nodular opacity within the left upper lobe, likely infectious or inflammatory in nature. A follow-up two view chest radiograph is recommended in 3-4 weeks to  document resolution. Electronically Signed   By: Helyn Numbers M.D.   On: 06/03/2023 19:18    Procedures Procedures    Medications Ordered in ED Medications  acetaminophen (TYLENOL) tablet 1,000 mg (has no administration in time range)  methylPREDNISolone sodium succinate (SOLU-MEDROL) 125 mg/2 mL injection 125 mg (125 mg Intravenous Given 06/03/23 1957)  albuterol (PROVENTIL) (2.5 MG/3ML) 0.083% nebulizer solution 5 mg (5 mg Nebulization Given 06/03/23 1956)  ipratropium (ATROVENT) nebulizer solution 0.5 mg (0.5 mg Nebulization Given 06/03/23 1957)  lactated ringers bolus 500 mL (500 mLs Intravenous New Bag/Given 06/03/23 1957)    ED Course/ Medical Decision Making/ A&P                                 Medical Decision Making Amount and/or Complexity of Data Reviewed External Data Reviewed: notes. Labs: ordered. Decision-making details documented in ED Course. Radiology: ordered and independent interpretation performed. Decision-making details documented in ED Course. ECG/medicine tests: ordered and independent interpretation performed. Decision-making details documented in ED Course.  Risk Prescription drug management.   Pt with multiple medical problems and comorbidities and presenting today with a complaint that caries a high risk for morbidity and  mortality.  Here today with complaint of above.  Concern for pneumonia, viral illness, COPD exacerbation.  Lower suspicion for ACS, PE or CHF.  No evidence of fluid overload on exam.  Patient is still wheezing and tachypneic here but sats are greater than 90% on his home 3 L.  Will give further DuoNebs, Solu-Medrol, IV fluid bolus and labs and x-ray are pending. I independently interpreted patient's labs and EKG and lactic acid is mildly elevated today at 2.2, CBC within normal limits, BMP with hemolysis causing the potassium to be 5.8 but normal renal function and sodium levels, viral panel positive for flu A I have independently visualized and interpreted pt's images today.  Chest x-ray without acute findings.  Radiology reported a multifocal nodular opacity in the left upper lobe however difficulty appreciating this when evaluating patient's x-ray.  Most likely viral also given patient's flu a positive testing.  On repeat evaluation after steroids and albuterol and Atrovent patient's work of breathing has improved.  Wheezing has improved.  However given patient's COPD, hypoxia, flu status will admit for ongoing supportive care.        Final Clinical Impression(s) / ED Diagnoses Final diagnoses:  COPD exacerbation (HCC)  Influenza A    Rx / DC Orders ED Discharge Orders     None         Gwyneth Sprout, MD 06/03/23 2041

## 2023-06-03 NOTE — ED Triage Notes (Signed)
PT here via [facility] for sob and fever for last few days.  Pt has been around neighbors with flu.  Hx of COPD. Initial sats of 85% on his normal 4L.   Pt was tripoding and gasping.  Given 1 duoneb of .5/2.5 that improved sats to 95 on 4L.  BS dimished with wheezing. Pt ao x 4.

## 2023-06-03 NOTE — Assessment & Plan Note (Signed)
Treat with Tamiflu Treat underlying COPD exacerbation Check procalcitonin

## 2023-06-03 NOTE — Assessment & Plan Note (Signed)
-  -   Will initiate: Steroid taper  -  Antibiotics rocephin - Albuterol  PRN, - scheduled duoneb,  -  Breo or Dulera at discharge   -  Mucinex.  Titrate O2 to saturation >90%. Follow patients respiratory status.  influenza PCR positive   VBG ordered    Currently mentating well no evidence of symptomatic hypercarbia

## 2023-06-03 NOTE — Subjective & Objective (Signed)
Patient history of COPD usually on oxygen at 3 to 4 L at home came in 3 days of URI symptoms myalgia malaise fevers cough shortness of breath but has been progressively getting worse try to use home breathing treatments but does not seem to improve now having trouble shortness of breath no chest pain no leg swelling when EMS arrived noted to be satting 85% on his baseline 3 to 4 L and started on DuoNeb with some improvement In ER tested positive for flu A

## 2023-06-04 ENCOUNTER — Inpatient Hospital Stay (HOSPITAL_COMMUNITY): Payer: 59

## 2023-06-04 DIAGNOSIS — J101 Influenza due to other identified influenza virus with other respiratory manifestations: Secondary | ICD-10-CM | POA: Diagnosis not present

## 2023-06-04 DIAGNOSIS — R079 Chest pain, unspecified: Secondary | ICD-10-CM

## 2023-06-04 DIAGNOSIS — J9621 Acute and chronic respiratory failure with hypoxia: Secondary | ICD-10-CM | POA: Diagnosis not present

## 2023-06-04 DIAGNOSIS — J441 Chronic obstructive pulmonary disease with (acute) exacerbation: Secondary | ICD-10-CM | POA: Diagnosis not present

## 2023-06-04 LAB — COMPREHENSIVE METABOLIC PANEL
ALT: 21 U/L (ref 0–44)
AST: 21 U/L (ref 15–41)
Albumin: 3 g/dL — ABNORMAL LOW (ref 3.5–5.0)
Alkaline Phosphatase: 50 U/L (ref 38–126)
Anion gap: 11 (ref 5–15)
BUN: 7 mg/dL — ABNORMAL LOW (ref 8–23)
CO2: 25 mmol/L (ref 22–32)
Calcium: 8.2 mg/dL — ABNORMAL LOW (ref 8.9–10.3)
Chloride: 103 mmol/L (ref 98–111)
Creatinine, Ser: 0.79 mg/dL (ref 0.61–1.24)
GFR, Estimated: >60 mL/min (ref >60)
Glucose, Bld: 158 mg/dL — ABNORMAL HIGH (ref 70–99)
Potassium: 4 mmol/L (ref 3.5–5.1)
Sodium: 139 mmol/L (ref 135–145)
Total Bilirubin: 0.6 mg/dL (ref 0.0–1.2)
Total Protein: 5.8 g/dL — ABNORMAL LOW (ref 6.5–8.1)

## 2023-06-04 LAB — MAGNESIUM: Magnesium: 1.9 mg/dL (ref 1.7–2.4)

## 2023-06-04 LAB — CBC
HCT: 37.7 % — ABNORMAL LOW (ref 39.0–52.0)
Hemoglobin: 12.5 g/dL — ABNORMAL LOW (ref 13.0–17.0)
MCH: 30.1 pg (ref 26.0–34.0)
MCHC: 33.2 g/dL (ref 30.0–36.0)
MCV: 90.8 fL (ref 80.0–100.0)
Platelets: 165 10*3/uL (ref 150–400)
RBC: 4.15 MIL/uL — ABNORMAL LOW (ref 4.22–5.81)
RDW: 12.8 % (ref 11.5–15.5)
WBC: 4.8 10*3/uL (ref 4.0–10.5)
nRBC: 0 % (ref 0.0–0.2)

## 2023-06-04 LAB — ECHOCARDIOGRAM COMPLETE
AR max vel: 2.59 cm2
AV Peak grad: 5.8 mm[Hg]
Ao pk vel: 1.2 m/s
Area-P 1/2: 3.31 cm2
Height: 69 in
S' Lateral: 3.1 cm
Weight: 2973.56 [oz_av]

## 2023-06-04 LAB — APTT: aPTT: 34 s (ref 24–36)

## 2023-06-04 LAB — PROTIME-INR
INR: 1.1 (ref 0.8–1.2)
Prothrombin Time: 14.2 s (ref 11.4–15.2)

## 2023-06-04 LAB — PREALBUMIN: Prealbumin: 15 mg/dL — ABNORMAL LOW (ref 18–38)

## 2023-06-04 LAB — MRSA NEXT GEN BY PCR, NASAL: MRSA by PCR Next Gen: DETECTED — AB

## 2023-06-04 LAB — CBG MONITORING, ED: Glucose-Capillary: 126 mg/dL — ABNORMAL HIGH (ref 70–99)

## 2023-06-04 LAB — PHOSPHORUS: Phosphorus: 7.9 mg/dL — ABNORMAL HIGH (ref 2.5–4.6)

## 2023-06-04 MED ORDER — BUPRENORPHINE HCL-NALOXONE HCL 8-2 MG SL SUBL
1.0000 | SUBLINGUAL_TABLET | Freq: Every day | SUBLINGUAL | Status: DC
  Administered 2023-06-04 – 2023-06-08 (×5): 1 via SUBLINGUAL
  Filled 2023-06-04 (×5): qty 1

## 2023-06-04 MED ORDER — QUETIAPINE FUMARATE 100 MG PO TABS
100.0000 mg | ORAL_TABLET | Freq: Every day | ORAL | Status: DC
  Administered 2023-06-04 – 2023-06-07 (×5): 100 mg via ORAL
  Filled 2023-06-04 (×5): qty 1

## 2023-06-04 MED ORDER — ALPRAZOLAM 0.5 MG PO TABS
1.0000 mg | ORAL_TABLET | Freq: Three times a day (TID) | ORAL | Status: DC
  Administered 2023-06-04 – 2023-06-08 (×14): 1 mg via ORAL
  Filled 2023-06-04: qty 2
  Filled 2023-06-04: qty 4
  Filled 2023-06-04 (×2): qty 2
  Filled 2023-06-04: qty 4
  Filled 2023-06-04: qty 2
  Filled 2023-06-04: qty 4
  Filled 2023-06-04 (×2): qty 2
  Filled 2023-06-04: qty 4
  Filled 2023-06-04 (×3): qty 2
  Filled 2023-06-04: qty 4

## 2023-06-04 MED ORDER — FLUOXETINE HCL 20 MG PO CAPS
20.0000 mg | ORAL_CAPSULE | Freq: Every day | ORAL | Status: DC
  Administered 2023-06-04 – 2023-06-08 (×5): 20 mg via ORAL
  Filled 2023-06-04 (×5): qty 1

## 2023-06-04 MED ORDER — IOHEXOL 350 MG/ML SOLN
64.0000 mL | Freq: Once | INTRAVENOUS | Status: AC | PRN
  Administered 2023-06-04: 64 mL via INTRAVENOUS

## 2023-06-04 MED ORDER — TAMSULOSIN HCL 0.4 MG PO CAPS
0.4000 mg | ORAL_CAPSULE | Freq: Every day | ORAL | Status: DC
  Administered 2023-06-04 – 2023-06-08 (×5): 0.4 mg via ORAL
  Filled 2023-06-04 (×5): qty 1

## 2023-06-04 MED ORDER — SODIUM CHLORIDE 0.9 % IV SOLN: INTRAVENOUS | Status: AC

## 2023-06-04 NOTE — Progress Notes (Signed)
Echocardiogram 2D Echocardiogram has been performed.  Chris [name] 06/04/2023, 12:32 PM

## 2023-06-04 NOTE — ED Notes (Signed)
 Patient transported to CT via stretcher.

## 2023-06-04 NOTE — Progress Notes (Signed)
Attempted Echocardiogram, patient refused because he wants to eat, Will attempt at a later time.

## 2023-06-04 NOTE — Progress Notes (Signed)
[facility]                                                                               Chris [name], is a 68 y.o. male, DOB - 1955-06-13, GNF:[id number] Admit date - 06/03/2023    Outpatient Primary MD for the patient is [facility]  LOS - 1  days    Brief summary   Chris [name] is a 68 y.o. male with medical history significant of COPD, chronic hypoxia on 3-4 L, anxiety, presented with worsening shortness of breath . He was tested positive for influenza A.    Assessment & Plan    Assessment and Plan:  Acute on chronic respiratory failure secondary to acute copd exacerbation and influenza A infection.  CTA ruled out PE and pneumonia.  Follow up blood cultures.  Continue with tamiflu and prednisone to complete the course.  Resume bronchodilators. Flutter valve added.  Therapy evaluations ordered.   Atypical chest pain Resolved.  CTA ruled out PE.  Echocardiogram shows preserved LVEF, with grade 1 dysfunction.without any regional wall abnormalities.      Estimated body mass index is 27.44 kg/m as calculated from the following:   Height as of this encounter: 5\' 9"  (1.753 m).   Weight as of this encounter: 84.3 kg.  Code Status: full code.  DVT Prophylaxis:  SCDs Start: 06/03/23 2234   Level of Care: Level of care: Progressive Family Communication:none at bedside.   Disposition Plan:     Remains inpatient appropriate:  pending improvement in his respiratory status. Should be ready for discharge by tomorrow.   Procedures:  Echocardiogram.   Consultants:   None.   Antimicrobials:   Anti-infectives (From admission, onward)    Start     Dose/Rate Route Frequency Ordered Stop   06/03/23 2245  cefTRIAXone (ROCEPHIN) 1 g in sodium chloride 0.9 % 100 mL IVPB        1 g 200 mL/hr over 30 Minutes Intravenous Every 24 hours 06/03/23 2233 06/08/23 2159   06/03/23 2245  oseltamivir (TAMIFLU) capsule 75 mg        75 mg Oral 2 times  daily 06/03/23 2233 06/08/23 2159        Medications  Scheduled Meds:  ALPRAZolam  1 mg Oral TID   buprenorphine-naloxone  1 tablet Sublingual Daily   FLUoxetine  20 mg Oral Daily   guaiFENesin  600 mg Oral BID   ipratropium-albuterol  3 mL Nebulization Q6H   oseltamivir  75 mg Oral BID   [START ON 06/05/2023] predniSONE  40 mg Oral Q breakfast   QUEtiapine  100 mg Oral QHS   tamsulosin  0.4 mg Oral Daily   Continuous Infusions:  cefTRIAXone (ROCEPHIN)  IV Stopped (06/03/23 2310)   PRN Meds:.acetaminophen **OR** acetaminophen, albuterol, HYDROcodone-acetaminophen, ondansetron **OR** ondansetron (ZOFRAN) IV    Subjective:   Chris [name] was seen and examined today.  Pt reports feeling better than yesterday. No chest pain. No palpitations.   Objective:   Vitals:   06/04/23 0700 06/04/23 0900 06/04/23 0956 06/04/23 1100  BP: (!) 136/99 117/60  125/70  Pulse: 75 61  62  Resp: 19 12  13  Temp:   98.6 F (37 C)   TempSrc:   Oral   SpO2: 96% 97%  97%  Weight:      Height:        Intake/Output Summary (Last 24 hours) at 06/04/2023 1402 Last data filed at 06/04/2023 2956 Gross per 24 hour  Intake 1388.92 ml  Output 1500 ml  Net -111.08 ml   Filed Weights   06/03/23 1816  Weight: 84.3 kg     Exam General exam: Appears calm and comfortable  Respiratory system: Clear to auscultation. Respiratory effort normal. Cardiovascular system: S1 & S2 heard, RRR. No JVD,  Gastrointestinal system: Abdomen is nondistended, soft and nontender.  Central nervous system: Alert and oriented. No focal neurological deficits. Extremities: Symmetric 5 x 5 power. Skin: No rashes, lesions or ulcers Psychiatry:  Mood & affect appropriate.     Data Reviewed:  I have personally reviewed following labs and imaging studies   CBC Lab Results  Component Value Date   WBC 4.8 06/04/2023   RBC 4.15 (L) 06/04/2023   HGB 12.5 (L) 06/04/2023   HCT 37.7 (L) 06/04/2023   MCV 90.8 06/04/2023    MCH 30.1 06/04/2023   PLT 165 06/04/2023   MCHC 33.2 06/04/2023   RDW 12.8 06/04/2023   LYMPHSABS 0.6 (L) 06/03/2023   MONOABS 0.9 06/03/2023   EOSABS 0.0 06/03/2023   BASOSABS 0.1 06/03/2023     Last metabolic panel Lab Results  Component Value Date   NA 139 06/04/2023   K 4.0 06/04/2023   CL 103 06/04/2023   CO2 25 06/04/2023   BUN 7 (L) 06/04/2023   CREATININE 0.79 06/04/2023   GLUCOSE 158 (H) 06/04/2023   GFRNONAA >60 06/04/2023   GFRAA >60 05/14/2019   CALCIUM 8.2 (L) 06/04/2023   PHOS 7.9 (H) 06/04/2023   PROT 5.8 (L) 06/04/2023   ALBUMIN 3.0 (L) 06/04/2023   BILITOT 0.6 06/04/2023   ALKPHOS 50 06/04/2023   AST 21 06/04/2023   ALT 21 06/04/2023   ANIONGAP 11 06/04/2023    CBG (last 3)  Recent Labs    06/04/23 0759  GLUCAP 126*      Coagulation Profile: Recent Labs  Lab 06/04/23 0420  INR 1.1     Radiology Studies: ECHOCARDIOGRAM COMPLETE Result Date: 06/04/2023    ECHOCARDIOGRAM REPORT   Patient Name:   Chris [name] Date of Exam: 06/04/2023 Medical Rec #:  [id number]       Height:       69.0 in Accession #:    [id number]      Weight:       185.8 lb Date of Birth:  1955/11/21       BSA:          2.003 m Patient Age:    67 years        BP:           136/99 mmHg Patient Gender: M               HR:           65 bpm. Exam Location:  Inpatient Procedure: 2D Echo, Cardiac Doppler and Color Doppler Indications:    Chest Pain R07.9  History:        Patient has no prior history of Echocardiogram examinations.                 COPD; Signs/Symptoms:Chest Pain.  Sonographer:    Lucendia Herrlich RCS Referring Phys: Kenn File DOUTOVA  Sonographer  Comments: No apical window and suboptimal parasternal window. IMPRESSIONS  1. Left ventricular ejection fraction, by estimation, is 55 to 60%. The left ventricle has normal function. The left ventricle has no regional wall motion abnormalities. Left ventricular diastolic parameters are consistent with Grade I diastolic  dysfunction (impaired relaxation).  2. Right ventricular systolic function is low normal. The right ventricular size is normal. There is normal pulmonary artery systolic pressure. The estimated right ventricular systolic pressure is 23.8 mmHg.  3. The mitral valve is abnormal. Trivial mitral valve regurgitation.  4. The aortic valve is tricuspid. Aortic valve regurgitation is not visualized. Aortic valve sclerosis is present, with no evidence of aortic valve stenosis.  5. The inferior vena cava is dilated in size with >50% respiratory variability, suggesting right atrial pressure of 8 mmHg. Comparison(s): No prior Echocardiogram. FINDINGS  Left Ventricle: Left ventricular ejection fraction, by estimation, is 55 to 60%. The left ventricle has normal function. The left ventricle has no regional wall motion abnormalities. The left ventricular internal cavity size was normal in size. There is  no left ventricular hypertrophy. Left ventricular diastolic parameters are consistent with Grade I diastolic dysfunction (impaired relaxation). Indeterminate filling pressures. Right Ventricle: The right ventricular size is normal. No increase in right ventricular wall thickness. Right ventricular systolic function is low normal. There is normal pulmonary artery systolic pressure. The tricuspid regurgitant velocity is 1.99 m/s,  and with an assumed right atrial pressure of 8 mmHg, the estimated right ventricular systolic pressure is 23.8 mmHg. Left Atrium: Left atrial size was normal in size. Right Atrium: Right atrial size was normal in size. Pericardium: There is no evidence of pericardial effusion. Mitral Valve: The mitral valve is abnormal. Mild mitral annular calcification. Trivial mitral valve regurgitation. Tricuspid Valve: The tricuspid valve is grossly normal. Tricuspid valve regurgitation is trivial. Aortic Valve: The aortic valve is tricuspid. Aortic valve regurgitation is not visualized. Aortic valve sclerosis is  present, with no evidence of aortic valve stenosis. Aortic valve peak gradient measures 5.8 mmHg. Pulmonic Valve: The pulmonic valve was not well visualized. Pulmonic valve regurgitation is not visualized. Aorta: The aortic root and ascending aorta are structurally normal, with no evidence of dilitation. Venous: The inferior vena cava is dilated in size with greater than 50% respiratory variability, suggesting right atrial pressure of 8 mmHg. IAS/Shunts: No atrial level shunt detected by color flow Doppler.  LEFT VENTRICLE PLAX 2D LVIDd:         4.90 cm   Diastology LVIDs:         3.10 cm   LV e' medial:    8.38 cm/s LV PW:         1.00 cm   LV E/e' medial:  6.9 LV IVS:        0.90 cm   LV e' lateral:   7.05 cm/s LVOT diam:     2.10 cm   LV E/e' lateral: 8.2 LV SV:         62 LV SV Index:   31 LVOT Area:     3.46 cm  RIGHT VENTRICLE             IVC RV S prime:     10.90 cm/s  IVC diam: 3.00 cm TAPSE (M-mode): 2.1 cm LEFT ATRIUM           Index        RIGHT ATRIUM           Index LA diam:  4.10 cm 2.05 cm/m   RA Area:     21.30 cm LA Vol (A4C): 47.3 ml 23.62 ml/m  RA Volume:   58.10 ml  29.01 ml/m  AORTIC VALVE AV Area (Vmax): 2.59 cm AV Vmax:        120.00 cm/s AV Peak Grad:   5.8 mmHg LVOT Vmax:      89.90 cm/s LVOT Vmean:     56.100 cm/s LVOT VTI:       0.179 m  AORTA Ao Root diam: 3.50 cm MITRAL VALVE               TRICUSPID VALVE MV Area (PHT): 3.31 cm    TR Peak grad:   15.8 mmHg MV Decel Time: 229 msec    TR Vmax:        199.00 cm/s MV E velocity: 57.60 cm/s MV A velocity: 80.20 cm/s  SHUNTS MV E/A ratio:  0.72        Systemic VTI:  0.18 m                            Systemic Diam: 2.10 cm Zoila Shutter MD Electronically signed by Zoila Shutter MD Signature Date/Time: 06/04/2023/1:10:51 PM    Final    CT Angio Chest Pulmonary Embolism (PE) W or WO Contrast Result Date: 06/04/2023 CLINICAL DATA:  Pulmonary embolism suspected EXAM: CT ANGIOGRAPHY CHEST WITH CONTRAST TECHNIQUE: Multidetector CT imaging  of the chest was performed using the standard protocol during bolus administration of intravenous contrast. Multiplanar CT image reconstructions and MIPs were obtained to evaluate the vascular anatomy. RADIATION DOSE REDUCTION: This exam was performed according to the departmental dose-optimization program which includes automated exposure control, adjustment of the mA and/or kV according to patient size and/or use of iterative reconstruction technique. CONTRAST:  64mL OMNIPAQUE IOHEXOL 350 MG/ML SOLN COMPARISON:  05/31/2022 FINDINGS: Cardiovascular: Contrast injection is sufficient to demonstrate satisfactory opacification of the pulmonary arteries to the segmental level. There is no pulmonary embolus or evidence of right heart strain. The size of the main pulmonary artery is normal. Heart size is normal, with no pericardial effusion. The course and caliber of the aorta are normal. There is no atherosclerotic calcification. Opacification decreased due to pulmonary arterial phase contrast bolus timing. Mediastinum/Nodes: No mediastinal, hilar or axillary lymphadenopathy. Normal visualized thyroid. Thoracic esophageal course is normal. Lungs/Pleura: Airways are patent. No pleural effusion, lobar consolidation, pneumothorax or pulmonary infarction. Upper Abdomen: Contrast bolus timing is not optimized for evaluation of the abdominal organs. The visualized portions of the organs of the upper abdomen are normal. Musculoskeletal: No chest wall abnormality. No bony spinal canal stenosis. Review of the MIP images confirms the above findings. IMPRESSION: No pulmonary embolus or acute intrathoracic abnormality. Electronically Signed   By: Deatra Robinson M.D.   On: 06/04/2023 01:31   DG Chest Port 1 View Result Date: 06/03/2023 CLINICAL DATA:  Sepsis EXAM: PORTABLE CHEST 1 VIEW COMPARISON:  03/23/2023, CT 05/31/2022 FINDINGS: Focal opacity at the right cardiophrenic angle relates to prominent epicardial fat better seen on  prior CT examination. The lungs are symmetrically well expanded. There has developed progressive interstitial thickening which may relate to developing airway inflammation. Moreover, there is new multifocal nodular opacity within the left upper lobe, likely infectious or inflammatory in nature. No pneumothorax or pleural effusion. Cardiac size within normal limits. Pulmonary vascularity is normal. No acute bone. IMPRESSION: 1. Progressive interstitial thickening which may relate to developing airway  inflammation. 2. New multifocal nodular opacity within the left upper lobe, likely infectious or inflammatory in nature. A follow-up two view chest radiograph is recommended in 3-4 weeks to document resolution. Electronically Signed   By: Helyn Numbers M.D.   On: 06/03/2023 19:18       Kathlen Mody M.D. [facility] 06/04/2023, 2:02 PM  Available via Epic secure chat 7am-7pm After 7 pm, please refer to night coverage provider listed on amion.

## 2023-06-04 NOTE — Progress Notes (Signed)
CTA chest no evidence of pulmonary embolism or any acute intrathoracic abnormality.   Tereasa Coop, MD [facility] 06/04/2023, 1:35 AM

## 2023-06-04 NOTE — ED Notes (Signed)
Patient refusing echo as he "has not eaten in two days and is hungry".

## 2023-06-04 NOTE — ED Notes (Signed)
IV team at patient's bedside. 

## 2023-06-05 ENCOUNTER — Inpatient Hospital Stay (HOSPITAL_COMMUNITY): Payer: 59

## 2023-06-05 DIAGNOSIS — R079 Chest pain, unspecified: Secondary | ICD-10-CM | POA: Diagnosis not present

## 2023-06-05 DIAGNOSIS — J09X1 Influenza due to identified novel influenza A virus with pneumonia: Secondary | ICD-10-CM

## 2023-06-05 DIAGNOSIS — J441 Chronic obstructive pulmonary disease with (acute) exacerbation: Secondary | ICD-10-CM | POA: Diagnosis not present

## 2023-06-05 MED ORDER — ROFLUMILAST 500 MCG PO TABS
500.0000 ug | ORAL_TABLET | Freq: Every day | ORAL | Status: DC
  Administered 2023-06-05 – 2023-06-08 (×4): 500 ug via ORAL
  Filled 2023-06-05 (×4): qty 1

## 2023-06-05 MED ORDER — PNEUMOCOCCAL 20-VAL CONJ VACC 0.5 ML IM SUSY
0.5000 mL | PREFILLED_SYRINGE | INTRAMUSCULAR | Status: DC
  Filled 2023-06-05: qty 0.5

## 2023-06-05 MED ORDER — AZITHROMYCIN 500 MG PO TABS
500.0000 mg | ORAL_TABLET | Freq: Every day | ORAL | Status: DC
  Administered 2023-06-05 – 2023-06-08 (×4): 500 mg via ORAL
  Filled 2023-06-05 (×4): qty 1

## 2023-06-05 MED ORDER — IPRATROPIUM-ALBUTEROL 0.5-2.5 (3) MG/3ML IN SOLN: 3.0000 mL | RESPIRATORY_TRACT | Status: DC | PRN

## 2023-06-05 NOTE — Evaluation (Signed)
Physical Therapy Evaluation Patient Details Name: Chris [name] MRN: [id number] DOB: Jul 16, 1955 Today's Date: 06/05/2023  History of Present Illness  Pt is 68 yo male who presents on 06/03/23 with worsening SOB in setting of COPD. Pt positive for flu A. PMH: COPD, chronic hypoxia on 3-4 L O2, anxiety  Clinical Impression  Pt from home, girlfriend stays with him 90% of time per his report. He reports independence at baseline without AD (does own a cane) and stays on 4L O2. Pt could tolerate only 10' ambulation at a time today and needed O2 increase to 5L to keep sats >85%. End of session pt on 4L O2 with sats 92%, HR 84 bpm, and BP 123/90. Will follow acutely but do not anticipate pt needing PT at d/c.         If plan is discharge home, recommend the following: Assist for transportation;Assistance with cooking/housework   Can travel by private vehicle        Equipment Recommendations None recommended by PT  Recommendations for Other Services       Functional Status Assessment Patient has had a recent decline in their functional status and demonstrates the ability to make significant improvements in function in a reasonable and predictable amount of time.     Precautions / Restrictions Precautions Precautions: None Restrictions Weight Bearing Restrictions Per Provider Order: No      Mobility  Bed Mobility Overal bed mobility: Modified Independent                  Transfers Overall transfer level: Needs assistance Equipment used: None Transfers: Sit to/from Stand Sit to Stand: Supervision           General transfer comment: vc's for breathing    Ambulation/Gait Ambulation/Gait assistance: Contact guard assist Gait Distance (Feet): 20 Feet (10'  x2) Assistive device: None Gait Pattern/deviations: Step-through pattern Gait velocity: decreased Gait velocity interpretation: <1.31 ft/sec, indicative of household ambulator   General Gait Details: pt with  increased wheezing and 4/4 DOE with ambulation. First 10' on 4L, dropped to 85% and took >5 mins for SPO2 to rise above 88%. Second 10' on 5L and pt dropped to 88% with 3 mins to return to 90%  Stairs            Wheelchair Mobility     Tilt Bed    Modified Rankin (Stroke Patients Only)       Balance Overall balance assessment: Mild deficits observed, not formally tested                                           Pertinent Vitals/Pain Pain Assessment Pain Assessment: Faces Faces Pain Scale: Hurts little more Pain Location: chest pain on L with coughing Pain Descriptors / Indicators: Sore Pain Intervention(s): Limited activity within patient's tolerance, Monitored during session    Home Living Family/patient expects to be discharged to:: Private residence Living Arrangements: Alone Available Help at Discharge: Family;Available PRN/intermittently Type of Home: Apartment Home Access: Level entry       Home Layout: One level Home Equipment: Grab bars - toilet;Grab bars - tub/shower;Shower seat;Cane - single point (oxygen 4L) Additional Comments: retired Management consultant. Girlfriend stays with him 90% of the time    Prior Function Prior Level of Function : Independent/Modified Independent             Mobility Comments: independent,  does not drive because of visual deficits. Does not use AD ADLs Comments: independent, only showers when girlfriend     Extremity/Trunk Assessment   Upper Extremity Assessment Upper Extremity Assessment: Overall WFL for tasks assessed (MMT WFL but pt fatigues very quickly with exertion)    Lower Extremity Assessment Lower Extremity Assessment: Overall WFL for tasks assessed (MMT WFL but pt fatigues quickly with exertion)    Cervical / Trunk Assessment Cervical / Trunk Assessment: Normal  Communication   Communication Communication: No apparent difficulties (does not have teeth) Cueing Techniques: Verbal cues   Cognition Arousal: Alert Behavior During Therapy: WFL for tasks assessed/performed Overall Cognitive Status: Within Functional Limits for tasks assessed                                          General Comments General comments (skin integrity, edema, etc.): cues for pursed lip breathing. Pt left on 4L O2, SPO2 92%, EOB eating lunch    Exercises     Assessment/Plan    PT Assessment Patient needs continued PT services  PT Problem List Cardiopulmonary status limiting activity;Decreased mobility;Decreased activity tolerance       PT Treatment Interventions Gait training;Functional mobility training;Therapeutic activities;Therapeutic exercise;Patient/family education    PT Goals (Current goals can be found in the Care Plan section)  Acute Rehab PT Goals Patient Stated Goal: return home PT Goal Formulation: With patient Time For Goal Achievement: 06/19/23 Potential to Achieve Goals: Fair    Frequency Min 1X/week     Co-evaluation               AM-PAC PT "6 Clicks" Mobility  Outcome Measure Help needed turning from your back to your side while in a flat bed without using bedrails?: None Help needed moving from lying on your back to sitting on the side of a flat bed without using bedrails?: None Help needed moving to and from a bed to a chair (including a wheelchair)?: A Little Help needed standing up from a chair using your arms (e.g., wheelchair or bedside chair)?: A Little Help needed to walk in hospital room?: A Little Help needed climbing 3-5 steps with a railing? : Total 6 Click Score: 18    End of Session Equipment Utilized During Treatment: Oxygen Activity Tolerance: Patient limited by fatigue Patient left: in bed;with call bell/phone within reach Nurse Communication: Mobility status;Other (comment) (desat with mobility) PT Visit Diagnosis: Difficulty in walking, not elsewhere classified (R26.2)    Time: 2130-8657 PT Time Calculation  (min) (ACUTE ONLY): 32 min   Charges:   PT Evaluation $PT Eval Moderate Complexity: 1 Mod PT Treatments $Gait Training: 8-22 mins PT General Charges $$ ACUTE PT VISIT: 1 Visit         Lyanne Co, PT  Acute Rehab Services Secure chat preferred Office [phone]   Lawana Chambers Tiffini Blacksher 06/05/2023, 2:05 PM

## 2023-06-05 NOTE — Progress Notes (Signed)
PROGRESS NOTE  Chris [name] ZOX:[id number] DOB: 03/07/1956   PCP: [facility]  Patient is from: Home.  Lives with girlfriend.  Uses rolling walker for ambulation.  DOA: 06/03/2023 LOS: 2  Chief complaints Chief Complaint  Patient presents with   Shortness of Breath     Brief Narrative / Interim history: 68 y.o. male with medical history significant of COPD, chronic hypoxia on 4-5L, anxiety, presented with worsening shortness of breath . He was tested positive for influenza A.  Patient was started on Tamiflu, steroid and breathing treatments and admitted for further care.  Oxygen requirement at baseline but continues to endorse respiratory distress.   Subjective: Seen and examined earlier this morning.  No major events overnight of this morning.  Continues to endorse shortness of, cough and weakness.  He says he feels better compared to when he came in.  He says he is halfway through his baseline.  He says he quit smoking about 2 years ago.  Denies alcohol.  Objective: Vitals:   06/05/23 0400 06/05/23 0553 06/05/23 0800 06/05/23 1151  BP: 116/61 127/60 (!) 144/63 122/79  Pulse: 60 62 83   Resp: 13 17 17    Temp:  98 F (36.7 C)  98.2 F (36.8 C)  TempSrc:  Oral  Oral  SpO2: 99% 100% 99% 94%  Weight:      Height:        Examination:  GENERAL: No apparent distress.  Nontoxic. HEENT: MMM.  Vision and hearing grossly intact.  NECK: Supple.  No apparent JVD.  RESP: Notable work of breathing as he speaks.  Rhonchi bilaterally. CVS:  RRR. Heart sounds normal.  ABD/GI/GU: BS+. Abd soft, NTND.  MSK/EXT:  Moves extremities. No apparent deformity. No edema.  SKIN: no apparent skin lesion or wound NEURO: Awake, alert and oriented appropriately.  No apparent focal neuro deficit. PSYCH: Calm. Normal affect.   Procedures:  None  Microbiology summarized: Influenza A PCR positive  Assessment and plan: COPD exacerbation due to influenza A infection: Presents  with worsening shortness of breath and cough.  Tested positive for influenza A.  CT angio chest negative for PE or acute intrathoracic abnormality.  Procalcitonin negative.  Still with respiratory distress including work of breathing, shortness of breath and rhonchi.  Does not feel quite close to baseline. -Continue Tamiflu, prednisone, scheduled and as needed nebulizers. -Change ceftriaxone to Zithromax -Ambulatory saturation assessment  Chronic respiratory failure with hypoxia: Reports using 4 to 5 L at baseline.  Currently on 3 L saturating in mid 90s at rest. -Ambulatory saturation assessment -Manage COPD and fluid as above  Chronic pain: Uses Suboxone and Flexeril at home. -Continue home Suboxone  Atypical chest pain: Resolved.  CTA chest negative for PE or acute intrathoracic finding.  Physical deconditioning -PT/OT eval  Anxiety -Continue home Ativan   Body mass index is 27.44 kg/m.           DVT prophylaxis:  SCDs Start: 06/03/23 2234  Code Status: Full code Family Communication: None at bedside Level of care: Med-Surg Status is: Inpatient Remains inpatient appropriate because: COPD exacerbation in the setting of influenza infection   Final disposition: Home Consultants:  None  55 minutes with more than 50% spent in reviewing records, counseling patient/family and coordinating care.   Sch Meds:  Scheduled Meds:  ALPRAZolam  1 mg Oral TID   azithromycin  500 mg Oral Daily   buprenorphine-naloxone  1 tablet Sublingual Daily   FLUoxetine  20 mg Oral Daily  guaiFENesin  600 mg Oral BID   ipratropium-albuterol  3 mL Nebulization Q6H   oseltamivir  75 mg Oral BID   [START ON 06/06/2023] pneumococcal 20-valent conjugate vaccine  0.5 mL Intramuscular Tomorrow-1000   predniSONE  40 mg Oral Q breakfast   QUEtiapine  100 mg Oral QHS   roflumilast  500 mcg Oral Daily   tamsulosin  0.4 mg Oral Daily   Continuous Infusions:   PRN Meds:.acetaminophen **OR**  acetaminophen, albuterol, HYDROcodone-acetaminophen, ondansetron **OR** ondansetron (ZOFRAN) IV  Antimicrobials: Anti-infectives (From admission, onward)    Start     Dose/Rate Route Frequency Ordered Stop   06/05/23 1245  azithromycin (ZITHROMAX) tablet 500 mg        500 mg Oral Daily 06/05/23 1156 06/10/23 0959   06/03/23 2245  cefTRIAXone (ROCEPHIN) 1 g in sodium chloride 0.9 % 100 mL IVPB  Status:  Discontinued        1 g 200 mL/hr over 30 Minutes Intravenous Every 24 hours 06/03/23 2233 06/05/23 1156   06/03/23 2245  oseltamivir (TAMIFLU) capsule 75 mg        75 mg Oral 2 times daily 06/03/23 2233 06/08/23 2159        I have personally reviewed the following labs and images: CBC: Recent Labs  Lab 06/03/23 1828 06/03/23 2140 06/04/23 0420  WBC 8.5  --  4.8  NEUTROABS 6.9  --   --   HGB 15.0 13.6 12.5*  HCT 46.6 40.0 37.7*  MCV 93.4  --  90.8  PLT 156  --  165   BMP &GFR Recent Labs  Lab 06/03/23 1828 06/03/23 2135 06/03/23 2140 06/04/23 0420  NA 135 135 134* 139  K 5.8* 4.2 4.1 4.0  CL 96* 98  --  103  CO2 25 23  --  25  GLUCOSE 100* 112*  --  158*  BUN 7* 6*  --  7*  CREATININE 0.87 0.74  --  0.79  CALCIUM 9.1 8.8*  --  8.2*  MG  --  1.9  --  1.9  PHOS  --  2.3*  --  7.9*   Estimated Creatinine Clearance: 92.1 mL/min (by C-G formula based on SCr of 0.79 mg/dL). Liver & Pancreas: Recent Labs  Lab 06/03/23 1828 06/04/23 0420  AST 36 21  ALT 19 21  ALKPHOS 65 50  BILITOT 1.4* 0.6  PROT 7.2 5.8*  ALBUMIN 3.9 3.0*   No results for input(s): "LIPASE", "AMYLASE" in the last 168 hours. No results for input(s): "AMMONIA" in the last 168 hours. Diabetic: No results for input(s): "HGBA1C" in the last 72 hours. Recent Labs  Lab 06/04/23 0759  GLUCAP 126*   Cardiac Enzymes: Recent Labs  Lab 06/03/23 2135  CKTOTAL 156   No results for input(s): "PROBNP" in the last 8760 hours. Coagulation Profile: Recent Labs  Lab 06/04/23 0420  INR 1.1    Thyroid Function Tests: No results for input(s): "TSH", "T4TOTAL", "FREET4", "T3FREE", "THYROIDAB" in the last 72 hours. Lipid Profile: No results for input(s): "CHOL", "HDL", "LDLCALC", "TRIG", "CHOLHDL", "LDLDIRECT" in the last 72 hours. Anemia Panel: No results for input(s): "VITAMINB12", "FOLATE", "FERRITIN", "TIBC", "IRON", "RETICCTPCT" in the last 72 hours. Urine analysis: No results found for: "COLORURINE", "APPEARANCEUR", "LABSPEC", "PHURINE", "GLUCOSEU", "HGBUR", "BILIRUBINUR", "KETONESUR", "PROTEINUR", "UROBILINOGEN", "NITRITE", "LEUKOCYTESUR" Sepsis Labs: Invalid input(s): "PROCALCITONIN", "LACTICIDVEN"  Microbiology: Recent Results (from the past 240 hours)  Resp panel by RT-PCR (RSV, Flu A&B, Covid) Anterior Nasal Swab     Status: Abnormal  Collection Time: 06/03/23  6:29 PM   Specimen: Anterior Nasal Swab  Result Value Ref Range Status   SARS Coronavirus 2 by RT PCR NEGATIVE NEGATIVE Final   Influenza A by PCR POSITIVE (A) NEGATIVE Final   Influenza B by PCR NEGATIVE NEGATIVE Final    Comment: (NOTE) The Xpert Xpress SARS-CoV-2/FLU/RSV plus assay is intended as an aid in the diagnosis of influenza from Nasopharyngeal swab specimens and should not be used as a sole basis for treatment. Nasal washings and aspirates are unacceptable for Xpert Xpress SARS-CoV-2/FLU/RSV testing.  Fact Sheet for Patients: BloggerCourse.com  Fact Sheet for Healthcare Providers: SeriousBroker.it  This test is not yet approved or cleared by the [place] FDA and has been authorized for detection and/or diagnosis of SARS-CoV-2 by FDA under an Emergency Use Authorization (EUA). This EUA will remain in effect (meaning this test can be used) for the duration of the COVID-19 declaration under Section 564(b)(1) of the Act, 21 U.S.C. section 360bbb-3(b)(1), unless the authorization is terminated or revoked.     Resp Syncytial Virus by  PCR NEGATIVE NEGATIVE Final    Comment: (NOTE) Fact Sheet for Patients: BloggerCourse.com  Fact Sheet for Healthcare Providers: SeriousBroker.it  This test is not yet approved or cleared by the [place] FDA and has been authorized for detection and/or diagnosis of SARS-CoV-2 by FDA under an Emergency Use Authorization (EUA). This EUA will remain in effect (meaning this test can be used) for the duration of the COVID-19 declaration under Section 564(b)(1) of the Act, 21 U.S.C. section 360bbb-3(b)(1), unless the authorization is terminated or revoked.  Performed at [facility], [address] [place], [place] [address]   Blood Culture (routine x 2)     Status: None (Preliminary result)   Collection Time: 06/03/23  6:58 PM   Specimen: BLOOD LEFT HAND  Result Value Ref Range Status   Specimen Description BLOOD LEFT HAND  Final   Special Requests   Final    BOTTLES DRAWN AEROBIC ONLY Blood Culture results may not be optimal due to an inadequate volume of blood received in culture bottles   Culture   Final    NO GROWTH 2 DAYS Performed at [facility], [address] [place], [place] [address]    Report Status PENDING  Incomplete  Culture, blood (Routine X 2) w Reflex to ID Panel     Status: None (Preliminary result)   Collection Time: 06/03/23  9:25 PM   Specimen: BLOOD LEFT FOREARM  Result Value Ref Range Status   Specimen Description BLOOD LEFT FOREARM  Final   Special Requests   Final    BOTTLES DRAWN AEROBIC AND ANAEROBIC Blood Culture results may not be optimal due to an inadequate volume of blood received in culture bottles   Culture   Final    NO GROWTH 1 DAY Performed at [facility], [address] [place], [place] [address]    Report Status PENDING  Incomplete  MRSA Next Gen by PCR, Nasal     Status: Abnormal   Collection Time: 06/04/23  9:22 PM   Specimen: Nasal Mucosa; Nasal Swab  Result  Value Ref Range Status   MRSA by PCR Next Gen DETECTED (A) NOT DETECTED Final    Comment: RESULT CALLED TO, READ BACK BY AND VERIFIED WITH: M BURNETTE,RN@[id number]  06/04/23 MK (NOTE) The GeneXpert MRSA Assay (FDA approved for NASAL specimens only), is one component of a comprehensive MRSA colonization surveillance program. It is  not intended to diagnose MRSA infection nor to guide or monitor treatment for MRSA infections. Test performance is not FDA approved in patients less than 32 years old. Performed at [facility], [address], [place], [place] [address]     Radiology Studies: ECHOCARDIOGRAM COMPLETE Result Date: 06/04/2023    ECHOCARDIOGRAM REPORT   Patient Name:   JALEAL SCHLIEP Date of Exam: 06/04/2023 Medical Rec #:  [id number]       Height:       69.0 in Accession #:    [id number]      Weight:       185.8 lb Date of Birth:  05-14-1955       BSA:          2.003 m Patient Age:    67 years        BP:           136/99 mmHg Patient Gender: M               HR:           65 bpm. Exam Location:  Inpatient Procedure: 2D Echo, Cardiac Doppler and Color Doppler Indications:    Chest Pain R07.9  History:        Patient has no prior history of Echocardiogram examinations.                 COPD; Signs/Symptoms:Chest Pain.  Sonographer:    Lucendia Herrlich RCS Referring Phys: [phone] ANASTASSIA DOUTOVA  Sonographer Comments: No apical window and suboptimal parasternal window. IMPRESSIONS  1. Left ventricular ejection fraction, by estimation, is 55 to 60%. The left ventricle has normal function. The left ventricle has no regional wall motion abnormalities. Left ventricular diastolic parameters are consistent with Grade I diastolic dysfunction (impaired relaxation).  2. Right ventricular systolic function is low normal. The right ventricular size is normal. There is normal pulmonary artery systolic pressure. The estimated right ventricular systolic pressure is 23.8 mmHg.  3. The mitral valve is abnormal.  Trivial mitral valve regurgitation.  4. The aortic valve is tricuspid. Aortic valve regurgitation is not visualized. Aortic valve sclerosis is present, with no evidence of aortic valve stenosis.  5. The inferior vena cava is dilated in size with >50% respiratory variability, suggesting right atrial pressure of 8 mmHg. Comparison(s): No prior Echocardiogram. FINDINGS  Left Ventricle: Left ventricular ejection fraction, by estimation, is 55 to 60%. The left ventricle has normal function. The left ventricle has no regional wall motion abnormalities. The left ventricular internal cavity size was normal in size. There is  no left ventricular hypertrophy. Left ventricular diastolic parameters are consistent with Grade I diastolic dysfunction (impaired relaxation). Indeterminate filling pressures. Right Ventricle: The right ventricular size is normal. No increase in right ventricular wall thickness. Right ventricular systolic function is low normal. There is normal pulmonary artery systolic pressure. The tricuspid regurgitant velocity is 1.99 m/s,  and with an assumed right atrial pressure of 8 mmHg, the estimated right ventricular systolic pressure is 23.8 mmHg. Left Atrium: Left atrial size was normal in size. Right Atrium: Right atrial size was normal in size. Pericardium: There is no evidence of pericardial effusion. Mitral Valve: The mitral valve is abnormal. Mild mitral annular calcification. Trivial mitral valve regurgitation. Tricuspid Valve: The tricuspid valve is grossly normal. Tricuspid valve regurgitation is trivial. Aortic Valve: The aortic valve is tricuspid. Aortic valve regurgitation is not visualized. Aortic valve sclerosis is present, with no evidence of aortic valve stenosis. Aortic  valve peak gradient measures 5.8 mmHg. Pulmonic Valve: The pulmonic valve was not well visualized. Pulmonic valve regurgitation is not visualized. Aorta: The aortic root and ascending aorta are structurally normal, with no  evidence of dilitation. Venous: The inferior vena cava is dilated in size with greater than 50% respiratory variability, suggesting right atrial pressure of 8 mmHg. IAS/Shunts: No atrial level shunt detected by color flow Doppler.  LEFT VENTRICLE PLAX 2D LVIDd:         4.90 cm   Diastology LVIDs:         3.10 cm   LV e' medial:    8.38 cm/s LV PW:         1.00 cm   LV E/e' medial:  6.9 LV IVS:        0.90 cm   LV e' lateral:   7.05 cm/s LVOT diam:     2.10 cm   LV E/e' lateral: 8.2 LV SV:         62 LV SV Index:   31 LVOT Area:     3.46 cm  RIGHT VENTRICLE             IVC RV S prime:     10.90 cm/s  IVC diam: 3.00 cm TAPSE (M-mode): 2.1 cm LEFT ATRIUM           Index        RIGHT ATRIUM           Index LA diam:      4.10 cm 2.05 cm/m   RA Area:     21.30 cm LA Vol (A4C): 47.3 ml 23.62 ml/m  RA Volume:   58.10 ml  29.01 ml/m  AORTIC VALVE AV Area (Vmax): 2.59 cm AV Vmax:        120.00 cm/s AV Peak Grad:   5.8 mmHg LVOT Vmax:      89.90 cm/s LVOT Vmean:     56.100 cm/s LVOT VTI:       0.179 m  AORTA Ao Root diam: 3.50 cm MITRAL VALVE               TRICUSPID VALVE MV Area (PHT): 3.31 cm    TR Peak grad:   15.8 mmHg MV Decel Time: 229 msec    TR Vmax:        199.00 cm/s MV E velocity: 57.60 cm/s MV A velocity: 80.20 cm/s  SHUNTS MV E/A ratio:  0.72        Systemic VTI:  0.18 m                            Systemic Diam: 2.10 cm Zoila Shutter MD Electronically signed by Zoila Shutter MD Signature Date/Time: 06/04/2023/1:10:51 PM    Final       Gena Laski T. Kaoru Benda [facility]  If 7PM-7AM, please contact night-coverage [email or web address] 06/05/2023, 12:00 PM

## 2023-06-06 ENCOUNTER — Inpatient Hospital Stay (HOSPITAL_COMMUNITY): Payer: 59

## 2023-06-06 ENCOUNTER — Telehealth: Payer: Self-pay | Admitting: Physician Assistant

## 2023-06-06 DIAGNOSIS — J441 Chronic obstructive pulmonary disease with (acute) exacerbation: Secondary | ICD-10-CM | POA: Diagnosis not present

## 2023-06-06 DIAGNOSIS — J101 Influenza due to other identified influenza virus with other respiratory manifestations: Secondary | ICD-10-CM | POA: Diagnosis not present

## 2023-06-06 DIAGNOSIS — J9621 Acute and chronic respiratory failure with hypoxia: Secondary | ICD-10-CM | POA: Diagnosis not present

## 2023-06-06 LAB — RENAL FUNCTION PANEL
Albumin: 3.2 g/dL — ABNORMAL LOW (ref 3.5–5.0)
Anion gap: 12 (ref 5–15)
BUN: 13 mg/dL (ref 8–23)
CO2: 30 mmol/L (ref 22–32)
Calcium: 9 mg/dL (ref 8.9–10.3)
Chloride: 97 mmol/L — ABNORMAL LOW (ref 98–111)
Creatinine, Ser: 0.75 mg/dL (ref 0.61–1.24)
GFR, Estimated: >60 mL/min (ref >60)
Glucose, Bld: 94 mg/dL (ref 70–99)
Phosphorus: 3.1 mg/dL (ref 2.5–4.6)
Potassium: 3.6 mmol/L (ref 3.5–5.1)
Sodium: 139 mmol/L (ref 135–145)

## 2023-06-06 LAB — RESPIRATORY PANEL BY PCR

## 2023-06-06 LAB — BRAIN NATRIURETIC PEPTIDE: B Natriuretic Peptide: 21.9 pg/mL (ref 0.0–100.0)

## 2023-06-06 LAB — CBC
HCT: 38.4 % — ABNORMAL LOW (ref 39.0–52.0)
Hemoglobin: 12.6 g/dL — ABNORMAL LOW (ref 13.0–17.0)
MCH: 30 pg (ref 26.0–34.0)
MCHC: 32.8 g/dL (ref 30.0–36.0)
MCV: 91.4 fL (ref 80.0–100.0)
Platelets: 181 10*3/uL (ref 150–400)
RBC: 4.2 MIL/uL — ABNORMAL LOW (ref 4.22–5.81)
RDW: 12.9 % (ref 11.5–15.5)
WBC: 7 10*3/uL (ref 4.0–10.5)
nRBC: 0 % (ref 0.0–0.2)

## 2023-06-06 LAB — PROCALCITONIN: Procalcitonin: <0.1 ng/mL

## 2023-06-06 LAB — MAGNESIUM: Magnesium: 1.8 mg/dL (ref 1.7–2.4)

## 2023-06-06 MED ORDER — LORAZEPAM 2 MG/ML IJ SOLN
INTRAMUSCULAR | Status: AC
  Administered 2023-06-06: 1 mg via INTRAVENOUS
  Filled 2023-06-06: qty 1

## 2023-06-06 MED ORDER — CHLORHEXIDINE GLUCONATE CLOTH 2 % EX PADS: 6.0000 | MEDICATED_PAD | Freq: Every day | CUTANEOUS | Status: DC

## 2023-06-06 MED ORDER — ACETYLCYSTEINE 20 % IN SOLN
3.0000 mL | Freq: Three times a day (TID) | RESPIRATORY_TRACT | Status: DC
  Administered 2023-06-06 (×4): 3 mL via RESPIRATORY_TRACT
  Filled 2023-06-06 (×6): qty 4

## 2023-06-06 MED ORDER — LORAZEPAM 2 MG/ML IJ SOLN: 1.0000 mg | Freq: Four times a day (QID) | INTRAMUSCULAR | Status: AC | PRN

## 2023-06-06 MED ORDER — METHYLPREDNISOLONE SODIUM SUCC 125 MG IJ SOLR
INTRAMUSCULAR | Status: AC
  Administered 2023-06-06: 60 mg via INTRAVENOUS
  Filled 2023-06-06: qty 2

## 2023-06-06 MED ORDER — FUROSEMIDE 10 MG/ML IJ SOLN
INTRAMUSCULAR | Status: AC
  Filled 2023-06-06: qty 4

## 2023-06-06 MED ORDER — ALBUTEROL SULFATE (2.5 MG/3ML) 0.083% IN NEBU
2.5000 mg | INHALATION_SOLUTION | RESPIRATORY_TRACT | Status: DC | PRN
  Administered 2023-06-08: 2.5 mg via RESPIRATORY_TRACT
  Filled 2023-06-06: qty 3

## 2023-06-06 MED ORDER — METHYLPREDNISOLONE SODIUM SUCC 125 MG IJ SOLR
60.0000 mg | Freq: Two times a day (BID) | INTRAMUSCULAR | Status: DC
  Administered 2023-06-06 – 2023-06-08 (×4): 60 mg via INTRAVENOUS
  Filled 2023-06-06 (×4): qty 2

## 2023-06-06 MED ORDER — MAGNESIUM SULFATE 2 GM/50ML IV SOLN
2.0000 g | Freq: Once | INTRAVENOUS | Status: AC
  Administered 2023-06-06: 2 g via INTRAVENOUS
  Filled 2023-06-06: qty 50

## 2023-06-06 MED ORDER — FUROSEMIDE 10 MG/ML IJ SOLN
40.0000 mg | Freq: Once | INTRAMUSCULAR | Status: AC
  Administered 2023-06-06: 40 mg via INTRAVENOUS

## 2023-06-06 MED ORDER — PANTOPRAZOLE SODIUM 40 MG PO TBEC
40.0000 mg | DELAYED_RELEASE_TABLET | Freq: Every day | ORAL | Status: DC
  Administered 2023-06-06 – 2023-06-08 (×3): 40 mg via ORAL
  Filled 2023-06-06 (×3): qty 1

## 2023-06-06 MED ORDER — MUPIROCIN 2 % EX OINT
1.0000 | TOPICAL_OINTMENT | Freq: Two times a day (BID) | CUTANEOUS | Status: DC
  Administered 2023-06-06 – 2023-06-08 (×3): 1 via NASAL
  Filled 2023-06-06 (×2): qty 22

## 2023-06-06 MED ORDER — ENOXAPARIN SODIUM 40 MG/0.4ML IJ SOSY
40.0000 mg | PREFILLED_SYRINGE | INTRAMUSCULAR | Status: DC
  Administered 2023-06-06 – 2023-06-08 (×3): 40 mg via SUBCUTANEOUS
  Filled 2023-06-06 (×3): qty 0.4

## 2023-06-06 MED ORDER — POTASSIUM CHLORIDE CRYS ER 20 MEQ PO TBCR
40.0000 meq | EXTENDED_RELEASE_TABLET | Freq: Once | ORAL | Status: AC
  Administered 2023-06-06: 40 meq via ORAL
  Filled 2023-06-06: qty 2

## 2023-06-06 NOTE — Progress Notes (Addendum)
 PROGRESS NOTE  THI SISEMORE FMW:[id number] DOB: 11-Oct-1955   PCP: [facility]  Patient is from: Home.  Lives with girlfriend.  Uses rolling walker for ambulation.  DOA: 06/03/2023 LOS: 3  Chief complaints Chief Complaint  Patient presents with   Shortness of Breath     Brief Narrative / Interim history: 68 y.o. male with medical history significant of COPD, chronic hypoxia on 4-5L, anxiety, presented with worsening shortness of breath . He was tested positive for influenza A.  Patient was started on Tamiflu , steroid and breathing treatments and admitted for further care.  Oxygen requirement at baseline but continues to endorse respiratory distress.   Subjective: Seen and examined earlier this morning.  No major events overnight of this morning.  Continues to endorse shortness of, cough and weakness.  He says he feels better compared to when he came in.  He says he is halfway through his baseline.  He says he quit smoking about 2 years ago.  Denies alcohol.  Objective: Vitals:   06/06/23 0000 06/06/23 0043 06/06/23 0217 06/06/23 0559  BP: (!) 104/32   (!) 108/54  Pulse: 97 75 74 73  Resp: 14 14 15 15   Temp:    98.2 F (36.8 C)  TempSrc:    Oral  SpO2: (!) 87% 90% 96% 94%  Weight:      Height:        Examination:  Awake Alert, No new F.N deficits, severely anxious affect [place].AT,PERRAL Supple Neck, No JVD,   Symmetrical Chest wall movement, greater movement bilaterally with some coarse crackles RRR,No Gallops, Rubs or new Murmurs,  +ve B.Sounds, Abd Soft, No tenderness,   No Cyanosis, Clubbing or edema    Procedures:  CT angiogram chest.  No PE  Echocardiogram.  Preserved EF 55 to 60%.  Grade 1 diastolic dysfunction  Microbiology summarized: Influenza A PCR positive  Assessment and plan:  Acute on chronic hypoxic respiratory failure in a patient with advanced underlying COPD who was on 4 to 5 L nasal cannula oxygen at baseline, brought on by  acute influenza A infection.    Tested positive for influenza A.  CT angio chest negative for PE or acute intrathoracic abnormality.  Procalcitonin negative.  Currently placed on Tamiflu , azithromycin , switch prednisone  to Solu-Medrol , continue nebulizer treatments, had a event of severe desaturation early this morning on 06/06/2023, likely brought on by anxiety and ripping off his oxygen to go to the bathroom.  Had to be briefly placed on heated high flow and subsequently BiPAP.  He was extensively counseled, IV Ativan  given, also seen by pulmonary, seems to have calm down, currently on 10 L of oxygen, use as needed heated high flow and BiPAP, pulmonary following, continue to monitor closely.  If there is significant decline may need intubation in ICU.   Chronic respiratory failure with hypoxia: Reports using 4 to 5 L at baseline.  Kindly see above, quit smoking 2 years ago.  Follows with Dr. Lindle who is his pulmonologist.  Acute on chronic diastolic CHF EF 55%.  Lasix  40 mg on 06/06/2023 and monitor.    Chronic pain: Uses Suboxone  and Flexeril at home. Continue home Suboxone   Atypical chest pain: Resolved.  CTA chest negative for PE or acute intrathoracic finding.  Physical deconditioning  -PT/OT eval  Anxiety -Continue dual and as needed benzodiazepines.   Body mass index is 27.44 kg/m.      DVT prophylaxis: Lovenox  added 06/06/23 SCDs Start: 06/03/23 2234  Code Status: Full  code Family Communication: None at bedside Level of care: Med-Surg Status is: Inpatient Remains inpatient appropriate because: COPD exacerbation in the setting of influenza infection   Final disposition: Home Consultants:  PCCM  55 minutes with more than 50% spent in reviewing records, counseling patient/family and coordinating care.   Sch Meds:  Scheduled Meds:  acetylcysteine   3 mL Nebulization TID   ALPRAZolam   1 mg Oral TID   azithromycin   500 mg Oral Daily   buprenorphine -naloxone   1 tablet  Sublingual Daily   FLUoxetine   20 mg Oral Daily   furosemide        guaiFENesin   600 mg Oral BID   ipratropium-albuterol   3 mL Nebulization Q6H   methylPREDNISolone  (SOLU-MEDROL ) injection  60 mg Intravenous Q12H   oseltamivir   75 mg Oral BID   pneumococcal 20-valent conjugate vaccine  0.5 mL Intramuscular Tomorrow-1000   potassium chloride   40 mEq Oral Once   QUEtiapine   100 mg Oral QHS   roflumilast   500 mcg Oral Daily   tamsulosin   0.4 mg Oral Daily   Continuous Infusions:   PRN Meds:.acetaminophen  **OR** acetaminophen , albuterol , furosemide , HYDROcodone -acetaminophen , ondansetron  **OR** ondansetron  (ZOFRAN ) IV  Antimicrobials: Anti-infectives (From admission, onward)    Start     Dose/Rate Route Frequency Ordered Stop   06/05/23 1245  azithromycin  (ZITHROMAX ) tablet 500 mg        500 mg Oral Daily 06/05/23 1156 06/10/23 0959   06/03/23 2245  cefTRIAXone  (ROCEPHIN ) 1 g in sodium chloride  0.9 % 100 mL IVPB  Status:  Discontinued        1 g 200 mL/hr over 30 Minutes Intravenous Every 24 hours 06/03/23 2233 06/05/23 1156   06/03/23 2245  oseltamivir  (TAMIFLU ) capsule 75 mg        75 mg Oral 2 times daily 06/03/23 2233 06/08/23 2159        I have personally reviewed the following labs and images:   Data Review:    Recent Labs  Lab 06/03/23 1828 06/03/23 2140 06/04/23 0420 06/06/23 0426  WBC 8.5  --  4.8 7.0  HGB 15.0 13.6 12.5* 12.6*  HCT 46.6 40.0 37.7* 38.4*  PLT 156  --  165 181  MCV 93.4  --  90.8 91.4  MCH 30.1  --  30.1 30.0  MCHC 32.2  --  33.2 32.8  RDW 13.0  --  12.8 12.9  LYMPHSABS 0.6*  --   --   --   MONOABS 0.9  --   --   --   EOSABS 0.0  --   --   --   BASOSABS 0.1  --   --   --     Recent Labs  Lab 06/03/23 1828 06/03/23 1903 06/03/23 2135 06/03/23 2136 06/03/23 2140 06/03/23 2156 06/04/23 0420 06/06/23 0426  NA 135  --  135  --  134*  --  139 139  K 5.8*  --  4.2  --  4.1  --  4.0 3.6  CL 96*  --  98  --   --   --  103 97*  CO2 25   --  23  --   --   --  25 30  ANIONGAP 14  --  14  --   --   --  11 12  GLUCOSE 100*  --  112*  --   --   --  158* 94  BUN 7*  --  6*  --   --   --  7* 13  CREATININE 0.87  --  0.74  --   --   --  0.79 0.75  AST 36  --   --   --   --   --  21  --   ALT 19  --   --   --   --   --  21  --   ALKPHOS 65  --   --   --   --   --  50  --   BILITOT 1.4*  --   --   --   --   --  0.6  --   ALBUMIN 3.9  --   --   --   --   --  3.0* 3.2*  PROCALCITON  --   --  <0.10  --   --   --   --   --   LATICACIDVEN  --  2.2*  --  1.8  --  1.5  --   --   INR  --   --   --   --   --   --  1.1  --   MG  --   --  1.9  --   --   --  1.9 1.8  PHOS  --   --  2.3*  --   --   --  7.9* 3.1  CALCIUM 9.1  --  8.8*  --   --   --  8.2* 9.0      Recent Labs  Lab 06/03/23 1828 06/03/23 1903 06/03/23 2135 06/03/23 2136 06/03/23 2156 06/04/23 0420 06/06/23 0426  PROCALCITON  --   --  <0.10  --   --   --   --   LATICACIDVEN  --  2.2*  --  1.8 1.5  --   --   INR  --   --   --   --   --  1.1  --   MG  --   --  1.9  --   --  1.9 1.8  CALCIUM 9.1  --  8.8*  --   --  8.2* 9.0     Micro Results Recent Results (from the past 240 hours)  Resp panel by RT-PCR (RSV, Flu A&B, Covid) Anterior Nasal Swab     Status: Abnormal   Collection Time: 06/03/23  6:29 PM   Specimen: Anterior Nasal Swab  Result Value Ref Range Status   SARS Coronavirus 2 by RT PCR NEGATIVE NEGATIVE Final   Influenza A by PCR POSITIVE (A) NEGATIVE Final   Influenza B by PCR NEGATIVE NEGATIVE Final    Comment: (NOTE) The Xpert Xpress SARS-CoV-2/FLU/RSV plus assay is intended as an aid in the diagnosis of influenza from Nasopharyngeal swab specimens and should not be used as a sole basis for treatment. Nasal washings and aspirates are unacceptable for Xpert Xpress SARS-CoV-2/FLU/RSV testing.  Fact Sheet for Patients: bloggercourse.com  Fact Sheet for Healthcare Providers: seriousbroker.it  This  test is not yet approved or cleared by the [place]  FDA and has been authorized for detection and/or diagnosis of SARS-CoV-2 by FDA under an Emergency Use Authorization (EUA). This EUA will remain in effect (meaning this test can be used) for the duration of the COVID-19 declaration under Section 564(b)(1) of the Act, 21 U.S.C. section 360bbb-3(b)(1), unless the authorization is terminated or revoked.     Resp Syncytial Virus by PCR NEGATIVE NEGATIVE Final    Comment: (NOTE) Fact Sheet for Patients: bloggercourse.com  Fact Sheet for Healthcare Providers: seriousbroker.it  This test is not yet approved or cleared by the [place]  FDA and has been authorized for detection and/or diagnosis of SARS-CoV-2 by FDA under an Emergency Use Authorization (EUA). This EUA will remain in effect (meaning this test can be used) for the duration of the COVID-19 declaration under Section 564(b)(1) of the Act, 21 U.S.C. section 360bbb-3(b)(1), unless the authorization is terminated or revoked.  Performed at [facility], [address] [place], [place] [address]   Blood Culture (routine x 2)     Status: None (Preliminary result)   Collection Time: 06/03/23  6:58 PM   Specimen: BLOOD LEFT HAND  Result Value Ref Range Status   Specimen Description BLOOD LEFT HAND  Final   Special Requests   Final    BOTTLES DRAWN AEROBIC ONLY Blood Culture results may not be optimal due to an inadequate volume of blood received in culture bottles   Culture   Final    NO GROWTH 3 DAYS Performed at [facility], [address], [place], [place] [address]    Report Status PENDING  Incomplete  Culture, blood (Routine X 2) w Reflex to ID Panel     Status: None (Preliminary result)   Collection Time: 06/03/23  9:25 PM   Specimen: BLOOD LEFT FOREARM  Result Value Ref Range Status   Specimen Description BLOOD LEFT FOREARM  Final   Special Requests    Final    BOTTLES DRAWN AEROBIC AND ANAEROBIC Blood Culture results may not be optimal due to an inadequate volume of blood received in culture bottles   Culture   Final    NO GROWTH 2 DAYS Performed at [facility], [address] [place], [place] [address]    Report Status PENDING  Incomplete  MRSA Next Gen by PCR, Nasal     Status: Abnormal   Collection Time: 06/04/23  9:22 PM   Specimen: Nasal Mucosa; Nasal Swab  Result Value Ref Range Status   MRSA by PCR Next Gen DETECTED (A) NOT DETECTED Final    Comment: RESULT CALLED TO, READ BACK BY AND VERIFIED WITH: M BURNETTE,RN@[id number]  06/04/23 MK (NOTE) The GeneXpert MRSA Assay (FDA approved for NASAL specimens only), is one component of a comprehensive MRSA colonization surveillance program. It is not intended to diagnose MRSA infection nor to guide or monitor treatment for MRSA infections. Test performance is not FDA approved in patients less than 88 years old. Performed at [facility], [address] [place], [place] [address]     Radiology Reports DG Chest [place] 1 View Result Date: 06/06/2023 CLINICAL DATA:  [id number] with shortness of breath, COPD history. EXAM: PORTABLE CHEST 1 VIEW COMPARISON:  Portable chest yesterday at 11:45 p.m., and CTA chest 06/04/2023. FINDINGS: 6:06 a.m. The lungs are emphysematous but clear. No pleural effusion is seen. The cardiomediastinal silhouette and vascular pattern are normal. Osteopenia and prior right shoulder replacement. Compare: Stable COPD chest. IMPRESSION: No evidence of acute chest disease. Emphysema. Electronically Signed   By: Francis Quam M.D.   On: 06/06/2023 06:55   DG CHEST PORT 1 VIEW Result Date: 06/05/2023 CLINICAL DATA:  Shortness of breath EXAM: PORTABLE CHEST 1 VIEW COMPARISON:  06/03/2023 FINDINGS: Heart size and pulmonary vascularity are normal. Emphysematous changes in the lungs. Streaky perihilar infiltrates with peribronchial thickening suggesting airways disease  or bronchitis. No developing consolidation or airspace disease. No pleural effusion or pneumothorax. Mediastinal contours appear intact.  Linear scarring or atelectasis in the right base. Postoperative change in the right shoulder. IMPRESSION: Perihilar and peribronchial changes likely representing airways disease or bronchitis. Emphysematous changes in the lungs. No developing consolidation. Electronically Signed   By: Elsie Gravely M.D.   On: 06/05/2023 23:51   ECHOCARDIOGRAM COMPLETE Result Date: 06/04/2023    ECHOCARDIOGRAM REPORT   Patient Name:   WILLIAN DONSON Date of Exam: 06/04/2023 Medical Rec #:  [id number]       Height:       69.0 in Accession #:    [id number]      Weight:       185.8 lb Date of Birth:  04-28-1956       BSA:          2.003 m Patient Age:    67 years        BP:           136/99 mmHg Patient Gender: M               HR:           65 bpm. Exam Location:  Inpatient Procedure: 2D Echo, Cardiac Doppler and Color Doppler Indications:    Chest Pain R07.9  History:        Patient has no prior history of Echocardiogram examinations.                 COPD; Signs/Symptoms:Chest Pain.  Sonographer:    Thea Norlander RCS Referring Phys: [phone] ANASTASSIA DOUTOVA  Sonographer Comments: No apical window and suboptimal parasternal window. IMPRESSIONS  1. Left ventricular ejection fraction, by estimation, is 55 to 60%. The left ventricle has normal function. The left ventricle has no regional wall motion abnormalities. Left ventricular diastolic parameters are consistent with Grade I diastolic dysfunction (impaired relaxation).  2. Right ventricular systolic function is low normal. The right ventricular size is normal. There is normal pulmonary artery systolic pressure. The estimated right ventricular systolic pressure is 23.8 mmHg.  3. The mitral valve is abnormal. Trivial mitral valve regurgitation.  4. The aortic valve is tricuspid. Aortic valve regurgitation is not visualized. Aortic valve sclerosis  is present, with no evidence of aortic valve stenosis.  5. The inferior vena cava is dilated in size with >50% respiratory variability, suggesting right atrial pressure of 8 mmHg. Comparison(s): No prior Echocardiogram. FINDINGS  Left Ventricle: Left ventricular ejection fraction, by estimation, is 55 to 60%. The left ventricle has normal function. The left ventricle has no regional wall motion abnormalities. The left ventricular internal cavity size was normal in size. There is  no left ventricular hypertrophy. Left ventricular diastolic parameters are consistent with Grade I diastolic dysfunction (impaired relaxation). Indeterminate filling pressures. Right Ventricle: The right ventricular size is normal. No increase in right ventricular wall thickness. Right ventricular systolic function is low normal. There is normal pulmonary artery systolic pressure. The tricuspid regurgitant velocity is 1.99 m/s,  and with an assumed right atrial pressure of 8 mmHg, the estimated right ventricular systolic pressure is 23.8 mmHg. Left Atrium: Left atrial size was normal in size. Right Atrium: Right atrial size was normal in size. Pericardium: There is no evidence of pericardial effusion. Mitral Valve: The mitral valve is abnormal. Mild mitral annular calcification. Trivial mitral valve regurgitation. Tricuspid Valve: The tricuspid valve is grossly normal. Tricuspid valve regurgitation is trivial. Aortic Valve: The aortic valve is tricuspid. Aortic valve regurgitation is not visualized. Aortic valve sclerosis is present, with no evidence of aortic valve  stenosis. Aortic valve peak gradient measures 5.8 mmHg. Pulmonic Valve: The pulmonic valve was not well visualized. Pulmonic valve regurgitation is not visualized. Aorta: The aortic root and ascending aorta are structurally normal, with no evidence of dilitation. Venous: The inferior vena cava is dilated in size with greater than 50% respiratory variability, suggesting right  atrial pressure of 8 mmHg. IAS/Shunts: No atrial level shunt detected by color flow Doppler.  LEFT VENTRICLE PLAX 2D LVIDd:         4.90 cm   Diastology LVIDs:         3.10 cm   LV e' medial:    8.38 cm/s LV PW:         1.00 cm   LV E/e' medial:  6.9 LV IVS:        0.90 cm   LV e' lateral:   7.05 cm/s LVOT diam:     2.10 cm   LV E/e' lateral: 8.2 LV SV:         62 LV SV Index:   31 LVOT Area:     3.46 cm  RIGHT VENTRICLE             IVC RV S prime:     10.90 cm/s  IVC diam: 3.00 cm TAPSE (M-mode): 2.1 cm LEFT ATRIUM           Index        RIGHT ATRIUM           Index LA diam:      4.10 cm 2.05 cm/m   RA Area:     21.30 cm LA Vol (A4C): 47.3 ml 23.62 ml/m  RA Volume:   58.10 ml  29.01 ml/m  AORTIC VALVE AV Area (Vmax): 2.59 cm AV Vmax:        120.00 cm/s AV Peak Grad:   5.8 mmHg LVOT Vmax:      89.90 cm/s LVOT Vmean:     56.100 cm/s LVOT VTI:       0.179 m  AORTA Ao Root diam: 3.50 cm MITRAL VALVE               TRICUSPID VALVE MV Area (PHT): 3.31 cm    TR Peak grad:   15.8 mmHg MV Decel Time: 229 msec    TR Vmax:        199.00 cm/s MV E velocity: 57.60 cm/s MV A velocity: 80.20 cm/s  SHUNTS MV E/A ratio:  0.72        Systemic VTI:  0.18 m                            Systemic Diam: 2.10 cm Vinie Maxcy MD Electronically signed by Vinie Maxcy MD Signature Date/Time: 06/04/2023/1:10:51 PM    Final    CT Angio Chest Pulmonary Embolism (PE) W or WO Contrast Result Date: 06/04/2023 CLINICAL DATA:  Pulmonary embolism suspected EXAM: CT ANGIOGRAPHY CHEST WITH CONTRAST TECHNIQUE: Multidetector CT imaging of the chest was performed using the standard protocol during bolus administration of intravenous contrast. Multiplanar CT image reconstructions and MIPs were obtained to evaluate the vascular anatomy. RADIATION DOSE REDUCTION: This exam was performed according to the departmental dose-optimization program which includes automated exposure control, adjustment of the mA and/or kV according to patient size and/or use  of iterative reconstruction technique. CONTRAST:  64mL OMNIPAQUE  IOHEXOL  350 MG/ML SOLN COMPARISON:  05/31/2022 FINDINGS: Cardiovascular: Contrast injection is sufficient to demonstrate satisfactory opacification of  the pulmonary arteries to the segmental level. There is no pulmonary embolus or evidence of right heart strain. The size of the main pulmonary artery is normal. Heart size is normal, with no pericardial effusion. The course and caliber of the aorta are normal. There is no atherosclerotic calcification. Opacification decreased due to pulmonary arterial phase contrast bolus timing. Mediastinum/Nodes: No mediastinal, hilar or axillary lymphadenopathy. Normal visualized thyroid. Thoracic esophageal course is normal. Lungs/Pleura: Airways are patent. No pleural effusion, lobar consolidation, pneumothorax or pulmonary infarction. Upper Abdomen: Contrast bolus timing is not optimized for evaluation of the abdominal organs. The visualized portions of the organs of the upper abdomen are normal. Musculoskeletal: No chest wall abnormality. No bony spinal canal stenosis. Review of the MIP images confirms the above findings. IMPRESSION: No pulmonary embolus or acute intrathoracic abnormality. Electronically Signed   By: Franky Stanford M.D.   On: 06/04/2023 01:31   DG Chest Port 1 View Result Date: 06/03/2023 CLINICAL DATA:  Sepsis EXAM: PORTABLE CHEST 1 VIEW COMPARISON:  03/23/2023, CT 05/31/2022 FINDINGS: Focal opacity at the right cardiophrenic angle relates to prominent epicardial fat better seen on prior CT examination. The lungs are symmetrically well expanded. There has developed progressive interstitial thickening which may relate to developing airway inflammation. Moreover, there is new multifocal nodular opacity within the left upper lobe, likely infectious or inflammatory in nature. No pneumothorax or pleural effusion. Cardiac size within normal limits. Pulmonary vascularity is normal. No acute bone.  IMPRESSION: 1. Progressive interstitial thickening which may relate to developing airway inflammation. 2. New multifocal nodular opacity within the left upper lobe, likely infectious or inflammatory in nature. A follow-up two view chest radiograph is recommended in 3-4 weeks to document resolution. Electronically Signed   By: Dorethia Molt M.D.   On: 06/03/2023 19:18      Signature  -   Lavada Stank M.D on 06/06/2023 at 9:19 AM   -  To page go to [email or web address]

## 2023-06-06 NOTE — Plan of Care (Signed)
 Pt has rested quietly throughout the night with no distress noted. Alert and oriented. ON AIRVO 40L/50% d/t sats dropping to low 80's. Stat CXR done. Neb treatments given. Voids per urinal. No other complaints voiced.     Problem: Clinical Measurements: Goal: Respiratory complications will improve Outcome: Progressing   Problem: Coping: Goal: Level of anxiety will decrease Outcome: Progressing   Problem: Pain Managment: Goal: General experience of comfort will improve and/or be controlled Outcome: Progressing   Problem: Education: Goal: Knowledge of disease or condition will improve Outcome: Progressing   Problem: Activity: Goal: Will verbalize the importance of balancing activity with adequate rest periods Outcome: Progressing

## 2023-06-06 NOTE — Progress Notes (Signed)
  RN reported that patient has shortness of breath.  Per chart review patient has history of COPD on 4 to 5 L of oxygen at home.  Patient has been admitted for COPD exacerbation due to influenza A infection. -Obtain chest x-ray which did not showed any new development of consolidation.  Chronic emphysematous lung change. -CPAP has been placed.  O2 sat 94% on 4 L. -Continue DuoNeb and supportive care.  Hasana Alcorta, MD [facility] 06/06/2023, 12:07 AM

## 2023-06-06 NOTE — Evaluation (Signed)
 Occupational Therapy Evaluation Patient Details Name: Chris [name] MRN: [id number] DOB: 1955-10-04 Today's Date: 06/06/2023   History of Present Illness Pt is 68 yo male who presents on 06/03/23 with worsening SOB in setting of COPD. Pt positive for flu A. PMH: COPD, chronic hypoxia on 3-4 L O2, anxiety   Clinical Impression   Pt c/o pain at chest with coughing, severe SOB, and wheezing. Pt lives alone, significant other stays with him 90% of time, assists with bathing, cooking/cleaning as Pt gets SOB with these activities. Otherwise, Pt able to complete dressing, toileting, ambulation without AD. Pt currently close to baseline but significantly more limited due to SOB. Pt would benefit from acute OT to maximize progress with activity tolerance, no follow up OT expected.        If plan is discharge home, recommend the following: A little help with walking and/or transfers;A little help with bathing/dressing/bathroom;Assist for transportation;Help with stairs or ramp for entrance;Assistance with cooking/housework    Functional Status Assessment  Patient has had a recent decline in their functional status and demonstrates the ability to make significant improvements in function in a reasonable and predictable amount of time.  Equipment Recommendations  None recommended by OT    Recommendations for Other Services       Precautions / Restrictions Precautions Precautions: None Restrictions Weight Bearing Restrictions Per Provider Order: No      Mobility Bed Mobility Overal bed mobility: Modified Independent                  Transfers Overall transfer level: Needs assistance Equipment used: None Transfers: Sit to/from Stand, Bed to chair/wheelchair/BSC Sit to Stand: Supervision           General transfer comment: supervision for safety, significant SOB      Balance Overall balance assessment: Mild deficits observed, not formally tested                                          ADL either performed or assessed with clinical judgement   ADL Overall ADL's : At baseline;Modified independent                                       General ADL Comments: limited by SOB, ind with ADLs, due to SOB at baseline Pt's girlfriend assists with bathing     Vision Baseline Vision/History: 4 Cataracts Ability to See in Adequate Light: 1 Impaired Patient Visual Report: No change from baseline       Perception         Praxis         Pertinent Vitals/Pain Pain Assessment Pain Assessment: 0-10 Pain Score: 6  Pain Location: chest pain with coughing Pain Descriptors / Indicators: Sore, Aching Pain Intervention(s): Monitored during session     Extremity/Trunk Assessment Upper Extremity Assessment Upper Extremity Assessment: RUE deficits/detail RUE Deficits / Details: history of RTC tear, decreased ABD/flexion RUE: Shoulder pain with ROM RUE Sensation: WNL RUE Coordination: decreased gross motor   Lower Extremity Assessment Lower Extremity Assessment: Defer to PT evaluation       Communication Communication Communication: No apparent difficulties   Cognition Arousal: Alert Behavior During Therapy: WFL for tasks assessed/performed Overall Cognitive Status: Within Functional Limits for tasks assessed  General Comments       Exercises     Shoulder Instructions      Home Living Family/patient expects to be discharged to:: Private residence Living Arrangements: Alone Available Help at Discharge: Family;Available PRN/intermittently Type of Home: Apartment Home Access: Level entry     Home Layout: One level     Bathroom Shower/Tub: Chief Strategy Officer: Standard         Additional Comments: retired management consultant. Girlfriend stays with him 90% of the time      Prior Functioning/Environment Prior Level of Function :  Independent/Modified Independent             Mobility Comments: independent, does not drive because of visual deficits. Does not use AD ADLs Comments: independent, only showers when girlfriend        OT Problem List: Decreased strength;Decreased activity tolerance;Pain      OT Treatment/Interventions: Self-care/ADL training;Therapeutic exercise;Energy conservation;Therapeutic activities    OT Goals(Current goals can be found in the care plan section) Acute Rehab OT Goals Patient Stated Goal: to improve SOB OT Goal Formulation: With patient Time For Goal Achievement: 06/20/23 Potential to Achieve Goals: Good  OT Frequency: Min 1X/week    Co-evaluation              AM-PAC OT 6 Clicks Daily Activity     Outcome Measure Help from another person eating meals?: None Help from another person taking care of personal grooming?: None Help from another person toileting, which includes using toliet, bedpan, or urinal?: None Help from another person bathing (including washing, rinsing, drying)?: A Lot Help from another person to put on and taking off regular upper body clothing?: None Help from another person to put on and taking off regular lower body clothing?: None 6 Click Score: 22   End of Session Nurse Communication: Mobility status  Activity Tolerance: Patient limited by fatigue Patient left: in bed;with call bell/phone within reach;with nursing/sitter in room  OT Visit Diagnosis: Muscle weakness (generalized) (M62.81);Pain Pain - part of body:  (chest)                Time: 1130-1150 OT Time Calculation (min): 20 min Charges:  OT General Charges $OT Visit: 1 Visit OT Evaluation $OT Eval Moderate Complexity: 1 [address] OTR/L   Chris [name] 06/06/2023, 11:55 AM

## 2023-06-06 NOTE — Progress Notes (Signed)
 RT called to bedside due to pt desat. When RT arrived at beside pt removed him self from [facility] and was yelling out  I can't breathe.RT tried to place pt back on [facility] however pt continue to yell and state  I dont want that, I can't do the hot air I want my regular oxygen. Pt continued to desat to 78, RT placed pt on bedside CPAP in BiPAP mode 10/5 with 10L O2 bleed,due to desat and increased WOB. Pt tolerated the CPAP spo2 increased to 97%. RT placed pt on HFNC 10L per CCM at bedside due to pt ripping off CPAP mask. Spo2 96% at this time, MD at bedside, CCM at bedside, no new orders given at this time, RT will monitor as needed.     06/06/23 9160  Therapy Vitals  Pulse Rate (!) 106  Resp (!) 27  MEWS Score/Color  MEWS Score 3  MEWS Score Color Yellow  Respiratory Assessment  Assessment Type Assess only  Respiratory Pattern Tachypnea  Chest Assessment Chest expansion symmetrical  Cough Non-productive  Bilateral Breath Sounds Diminished;Expiratory wheezes  Oxygen Therapy/Pulse Ox  O2 Device (S)  HFNC (per CCM)  O2 Therapy Oxygen humidified  O2 Flow Rate (L/min) 10 L/min  SpO2 100 %

## 2023-06-06 NOTE — Consult Note (Signed)
 NAME:  Chris [name], MRN:  [id number], DOB:  [birth year], LOS: 3 ADMISSION DATE:  06/03/2023, CONSULTATION DATE:  06/06/23 REFERRING MD:  Dennise CHIEF COMPLAINT:  Dyspnea   History of Present Illness:  Chris [name] is a 68 y.o. male who has a PMH as below including but not limited to COPD with chronic hypoxic respiratory failure on 3-4L O2 (Previously seen by Dr. Theophilus in 2021). He presented to [facility] ED 2/1 with 3 days of URI symptoms. He was found to be Flu A positive. He was admitted by [facility] for AECOPD and influenza A management.  On morning of 2/4, he had worsening dyspnea after tried to get up to use the bathroom. He was placed on HHFNC but did not like the heat aspect of it and had significant anxiety. He says that he sleeps with a fan and does not tolerate anything hot near his face.  He had increased WOB and desaturations to the 60s due to anxiety. BiPAP was ordered and PCCM was asked to see in consultation incase he would require intubation.  At the time of my arrival, he has settled out and is on NRB but agreeable to try salter HFNC with weaning as able. He has O2 sats of 100% currently and is able to converse and answer questions. He has expiratory wheezes bilaterally.  Pertinent  Medical History:  has Back pain; COPD (chronic obstructive pulmonary disease) (HCC); COPD exacerbation (HCC); Influenza A with pneumonia; Acute on chronic respiratory failure with hypoxia (HCC); and Chest pain on their problem list.  Significant Hospital Events: Including procedures, antibiotic start and stop dates in addition to other pertinent events   2/1 admit. 2/4 PCCM consult.  Interim History / Subjective:  On NRB with sats 100%. Feels a bit anxious but is settling out. Does not want anything hot near his face and asks for fan to always be present (sleeps with this at home).  Objective:  Blood pressure (!) 108/54, pulse 73, temperature 98.2 F (36.8 C), temperature source Oral, resp. rate 15,  height 5' 9 (1.753 m), weight 84.3 kg, SpO2 94%.    FiO2 (%):  [50 %-60 %] 50 %   Intake/Output Summary (Last 24 hours) at 06/06/2023 0839 Last data filed at 06/06/2023 0600 Gross per 24 hour  Intake 360 ml  Output 300 ml  Net 60 ml   Filed Weights   06/03/23 1816  Weight: 84.3 kg    Examination: General: Adult male, a bit anxious but in NAD. Neuro: A&O x 3, no deficits. HEENT: [place]/AT. Sclerae anicteric. EOMI. Cardiovascular: Tachy, regular, no M/R/G.  Lungs: Respirations slightly labored 2/2 anxiety and gradually improving. Expiratory wheezes bilaterally. Abdomen: BS x 4, soft, NT/ND.  Musculoskeletal: No gross deformities, no edema.  Skin: Intact, warm, no rashes.  Labs/imaging personally reviewed:  CTA chest 2/2 > neg.  Assessment & Plan:   AECOPD - 2/2 flu A. Last seen by pulmonary 2021 (Dr. Theophilus). - Continue Tamiflu , Solumedrol, BDs, Azithromycin . - Will send a message to our office to arrange for outpatient follow up and to re-establish with pulmonary.  Acute on chronic hypoxic respiratory failure - 2/2 above. - Continue supplemental O2 as needed to maintain SpO2 > 88%. Will try HFNC for now then transition back to baseline 3 - 4L. - No need for transfer to ICU at this point, seems to be settling down. - Routine bronchial hygiene. - PT/OT.  Rest per primary. Dr. Agarwala to see later today. If stable and back  to baseline, will likely sign off from our standpoint.   Best practice (evaluated daily):  Per primary.  Labs   CBC: Recent Labs  Lab 06/03/23 1828 06/03/23 2140 06/04/23 0420 06/06/23 0426  WBC 8.5  --  4.8 7.0  NEUTROABS 6.9  --   --   --   HGB 15.0 13.6 12.5* 12.6*  HCT 46.6 40.0 37.7* 38.4*  MCV 93.4  --  90.8 91.4  PLT 156  --  165 181    Basic Metabolic Panel: Recent Labs  Lab 06/03/23 1828 06/03/23 2135 06/03/23 2140 06/04/23 0420 06/06/23 0426  NA 135 135 134* 139 139  K 5.8* 4.2 4.1 4.0 3.6  CL 96* 98  --  103 97*  CO2 25 23   --  25 30  GLUCOSE 100* 112*  --  158* 94  BUN 7* 6*  --  7* 13  CREATININE 0.87 0.74  --  0.79 0.75  CALCIUM 9.1 8.8*  --  8.2* 9.0  MG  --  1.9  --  1.9 1.8  PHOS  --  2.3*  --  7.9* 3.1   GFR: Estimated Creatinine Clearance: 92.1 mL/min (by C-G formula based on SCr of 0.75 mg/dL). Recent Labs  Lab 06/03/23 1828 06/03/23 1903 06/03/23 2135 06/03/23 2136 06/03/23 2156 06/04/23 0420 06/06/23 0426  PROCALCITON  --   --  <0.10  --   --   --   --   WBC 8.5  --   --   --   --  4.8 7.0  LATICACIDVEN  --  2.2*  --  1.8 1.5  --   --     Liver Function Tests: Recent Labs  Lab 06/03/23 1828 06/04/23 0420 06/06/23 0426  AST 36 21  --   ALT 19 21  --   ALKPHOS 65 50  --   BILITOT 1.4* 0.6  --   PROT 7.2 5.8*  --   ALBUMIN 3.9 3.0* 3.2*   No results for input(s): LIPASE, AMYLASE in the last 168 hours. No results for input(s): AMMONIA in the last 168 hours.  ABG    Component Value Date/Time   HCO3 30.2 (H) 06/03/2023 2140   TCO2 31 06/03/2023 2140   O2SAT 99 06/03/2023 2140     Coagulation Profile: Recent Labs  Lab 06/04/23 0420  INR 1.1    Cardiac Enzymes: Recent Labs  Lab 06/03/23 2135  CKTOTAL 156    HbA1C: No results found for: HGBA1C  CBG: Recent Labs  Lab 06/04/23 0759  GLUCAP 126*    Review of Systems:   All negative; except for those that are bolded, which indicate positives.  Constitutional: weight loss, weight gain, night sweats, fevers, chills, fatigue, weakness.  HEENT: headaches, sore throat, sneezing, nasal congestion, post nasal drip, difficulty swallowing, tooth/dental problems, visual complaints, visual changes, ear aches. Neuro: difficulty with speech, weakness, numbness, ataxia. CV:  chest pain, orthopnea, PND, swelling in lower extremities, dizziness, palpitations, syncope.  Resp: cough, hemoptysis, dyspnea, wheezing. GI: heartburn, indigestion, abdominal pain, nausea, vomiting, diarrhea, constipation, change in bowel  habits, loss of appetite, hematemesis, melena, hematochezia.  GU: dysuria, change in color of urine, urgency or frequency, flank pain, hematuria. MSK: joint pain or swelling, decreased range of motion. Psych: change in mood or affect, depression, anxiety, suicidal ideations, homicidal ideations. Skin: rash, itching, bruising.   Past Medical History:  He,  has a past medical history of COPD (chronic obstructive pulmonary disease) (HCC).   Surgical History:  Past Surgical History:  Procedure Laterality Date   arm surgery       Social History:   reports that he has quit smoking. He has never used smokeless tobacco. He reports current alcohol use. He reports that he does not use drugs.   Family History:  His family history is not on file.   Allergies No Known Allergies   Home Medications  Prior to Admission medications   Medication Sig Start Date End Date Taking? Authorizing Provider  albuterol  (PROVENTIL ) (2.5 MG/3ML) 0.083% nebulizer solution Take 2.5 mg by nebulization every 6 (six) hours as needed for shortness of breath or wheezing. 03/27/23  Yes [provider]  albuterol  (VENTOLIN  HFA) 108 (90 Base) MCG/ACT inhaler Inhale 2 puffs into the lungs every 4 (four) hours as needed for wheezing or shortness of breath. 05/09/19  Yes [provider]  ALPRAZolam  (XANAX ) 1 MG tablet Take 1 mg by mouth 3 (three) times daily.   Yes [provider]  Buprenorphine  HCl-Naloxone  HCl 8-2 MG FILM Place under the tongue. Place 1/4 of film under the tongue daily   Yes [provider]  cyclobenzaprine (FLEXERIL) 10 MG tablet Take 10 mg by mouth 3 (three) times daily as needed for muscle spasms.   Yes [provider]  FLUoxetine  (PROZAC ) 20 MG capsule Take 20 mg by mouth daily. 05/14/23  Yes [provider]  metoprolol succinate (TOPROL-XL) 25 MG 24 hr tablet Take 12.5 mg by mouth daily. 02/24/23  Yes [provider]  predniSONE  (DELTASONE )  5 MG tablet Take 5 mg by mouth daily. 05/20/23  Yes [provider]  QUEtiapine  (SEROQUEL ) 100 MG tablet Take 100 mg by mouth at bedtime. 05/13/23  Yes [provider]  roflumilast  (DALIRESP ) 500 MCG TABS tablet Take 500 mcg by mouth daily. 05/14/23  Yes [provider]  tamsulosin  (FLOMAX ) 0.4 MG CAPS capsule Take 0.4 mg by mouth daily. 02/20/23  Yes [provider]  TRELEGY ELLIPTA 100-62.5-25 MCG/ACT AEPB Inhale 1 puff into the lungs daily. 03/17/23  Yes [provider]     Sammi Gore, PA - [facility] For pager details, please see AMION or use Epic chat  After 1900, please call [facility] for cross coverage needs 06/06/2023, 8:39 AM

## 2023-06-06 NOTE — Progress Notes (Addendum)
 Patient placed on CPAP due to increased SOB. Work of breathing improved with cpap but unable to maintain SpO2 above 88%. Patient switched to heated high flow. RR 15, SpO2 92% on 40L and 60%. No respiratory distress noted at this time. Will continue to wean as tolerated. Dr. Sundil, MD notified via secure chat.

## 2023-06-06 NOTE — Progress Notes (Signed)
Pharmacy re: Enoxaparin for VTE prophylaxis.  68 yr old male, 29", 84.3 kg, CBC stable.  Standard dose Enoxaparin 40 mg SQ q24h. Intermittent CBC. Pharmacy to sign off.   Dennie Fetters, [place] 06/06/2023 12:15 PM

## 2023-06-07 DIAGNOSIS — J441 Chronic obstructive pulmonary disease with (acute) exacerbation: Secondary | ICD-10-CM | POA: Diagnosis not present

## 2023-06-07 DIAGNOSIS — J9621 Acute and chronic respiratory failure with hypoxia: Secondary | ICD-10-CM | POA: Diagnosis not present

## 2023-06-07 LAB — C-REACTIVE PROTEIN: CRP: 3.5 mg/dL — ABNORMAL HIGH (ref <1.0)

## 2023-06-07 LAB — MAGNESIUM: Magnesium: 2.2 mg/dL (ref 1.7–2.4)

## 2023-06-07 LAB — CBC WITH DIFFERENTIAL/PLATELET
Abs Immature Granulocytes: 0.03 10*3/uL (ref 0.00–0.07)
Basophils Absolute: 0 10*3/uL (ref 0.0–0.1)
Basophils Relative: 0 %
Eosinophils Absolute: 0 10*3/uL (ref 0.0–0.5)
Eosinophils Relative: 0 %
HCT: 40.9 % (ref 39.0–52.0)
Hemoglobin: 13.7 g/dL (ref 13.0–17.0)
Immature Granulocytes: 0 %
Lymphocytes Relative: 19 %
Lymphs Abs: 1.3 10*3/uL (ref 0.7–4.0)
MCH: 29.7 pg (ref 26.0–34.0)
MCHC: 33.5 g/dL (ref 30.0–36.0)
MCV: 88.7 fL (ref 80.0–100.0)
Monocytes Absolute: 0.5 10*3/uL (ref 0.1–1.0)
Monocytes Relative: 8 %
Neutro Abs: 5 10*3/uL (ref 1.7–7.7)
Neutrophils Relative %: 73 %
Platelets: 182 10*3/uL (ref 150–400)
RBC: 4.61 MIL/uL (ref 4.22–5.81)
RDW: 12.5 % (ref 11.5–15.5)
WBC: 6.8 10*3/uL (ref 4.0–10.5)
nRBC: 0 % (ref 0.0–0.2)

## 2023-06-07 LAB — BRAIN NATRIURETIC PEPTIDE: B Natriuretic Peptide: 40.1 pg/mL (ref 0.0–100.0)

## 2023-06-07 LAB — BASIC METABOLIC PANEL
Anion gap: 11 (ref 5–15)
BUN: 10 mg/dL (ref 8–23)
CO2: 30 mmol/L (ref 22–32)
Calcium: 9.2 mg/dL (ref 8.9–10.3)
Chloride: 97 mmol/L — ABNORMAL LOW (ref 98–111)
Creatinine, Ser: 0.65 mg/dL (ref 0.61–1.24)
GFR, Estimated: >60 mL/min (ref >60)
Glucose, Bld: 139 mg/dL — ABNORMAL HIGH (ref 70–99)
Potassium: 4.1 mmol/L (ref 3.5–5.1)
Sodium: 138 mmol/L (ref 135–145)

## 2023-06-07 LAB — PROCALCITONIN: Procalcitonin: <0.1 ng/mL

## 2023-06-07 LAB — PHOSPHORUS: Phosphorus: 3.5 mg/dL (ref 2.5–4.6)

## 2023-06-07 MED ORDER — ACETYLCYSTEINE 20 % IN SOLN
3.0000 mL | Freq: Two times a day (BID) | RESPIRATORY_TRACT | Status: DC
  Administered 2023-06-07 – 2023-06-08 (×2): 3 mL via RESPIRATORY_TRACT
  Filled 2023-06-07 (×3): qty 4

## 2023-06-07 MED ORDER — IPRATROPIUM-ALBUTEROL 0.5-2.5 (3) MG/3ML IN SOLN
3.0000 mL | Freq: Two times a day (BID) | RESPIRATORY_TRACT | Status: DC
  Administered 2023-06-07 – 2023-06-08 (×2): 3 mL via RESPIRATORY_TRACT
  Filled 2023-06-07 (×2): qty 3

## 2023-06-07 NOTE — Progress Notes (Signed)
   06/07/23 2029  BiPAP/CPAP/SIPAP  Reason BIPAP/CPAP not in use Non-compliant  BiPAP/CPAP /SiPAP Vitals  Pulse Rate 74  Resp 17  SpO2 96 %  MEWS Score/Color  MEWS Score 0  MEWS Score Color Green   Refused machine in room

## 2023-06-07 NOTE — Progress Notes (Signed)
 PROGRESS NOTE  Chris [name] FMW:[id number] DOB: [birth year]   PCP: [facility]  Patient is from: Home.  Lives with girlfriend.  Uses rolling walker for ambulation.  DOA: 06/03/2023 LOS: 4  Chief complaints Chief Complaint  Patient presents with   Shortness of Breath     Brief Narrative / Interim history: 68 y.o. male with medical history significant of COPD, chronic hypoxia on 4-5L, anxiety, presented with worsening shortness of breath . He was tested positive for influenza A.  Patient was started on Tamiflu , steroid and breathing treatments and admitted for further care.  Oxygen requirement at baseline but continues to endorse respiratory distress.   Subjective:  Patient reports he is feeling better today, dyspnea has improved  Objective: Vitals:   06/07/23 0300 06/07/23 0500 06/07/23 0700 06/07/23 0800  BP:    114/60  Pulse: (!) 58 (!) 55 61 69  Resp:      Temp:    98.2 F (36.8 C)  TempSrc:    Oral  SpO2: 96% 97% 98% 92%  Weight:      Height:        Examination:   Awake Alert, Oriented X 3, anxious Symmetrical Chest wall movement, fair air entry bilaterally, use of accessory muscles RRR,No Gallops,Rubs or new Murmurs, No Parasternal Heave +ve B.Sounds, Abd Soft, No tenderness, No rebound - guarding or rigidity. No Cyanosis, Clubbing or edema, No new Rash or bruise      Procedures:  CT angiogram chest.  No PE  Echocardiogram.  Preserved EF 55 to 60%.  Grade 1 diastolic dysfunction  Microbiology summarized: Influenza A PCR positive  Assessment and plan:  Acute on chronic hypoxic respiratory failure in a patient with advanced underlying COPD who was on 4 to 5 L nasal cannula oxygen at baseline, brought on by acute influenza A infection.    Tested positive for influenza A.  CT angio chest negative for PE or acute intrathoracic abnormality.  Procalcitonin negative.  Currently placed on Tamiflu , azithromycin , switch prednisone  to Solu-Medrol ,  continue nebulizer treatments. -Recurrent hypoxic event and respiratory distress yesterday related to anxiety and pulling off his oxygen, this has improved, no BiPAP requirement overnight -Significant anxiety, as needed Ativan   -Respiratory status much improved, he is down to 6 L oxygen this morning  Chronic respiratory failure with hypoxia: Reports using 4 to 5 L at baseline.  Kindly see above, quit smoking 2 years ago.  Follows with Dr. Lindle who is his pulmonologist.  Acute on chronic diastolic CHF EF 55%.  Lasix  40 mg on 06/06/2023 and monitor.    Chronic pain: Uses Suboxone  and Flexeril at home. Continue home Suboxone   Atypical chest pain: Resolved.  CTA chest negative for PE or acute intrathoracic finding.  Physical deconditioning  -PT/OT eval  Anxiety -Continue dual and as needed benzodiazepines.   Body mass index is 27.44 kg/m.      DVT prophylaxis: Lovenox  added 06/06/23 enoxaparin  (LOVENOX ) injection 40 mg Start: 06/06/23 1100 SCDs Start: 06/03/23 2234  Code Status: Full code Family Communication: None at bedside Level of care: Med-Surg Status is: Inpatient Remains inpatient appropriate because: COPD exacerbation in the setting of influenza infection   Final disposition: Home Consultants:  PCCM  55 minutes with more than 50% spent in reviewing records, counseling patient/family and coordinating care.   Sch Meds:  Scheduled Meds:  acetylcysteine   3 mL Nebulization TID   ALPRAZolam   1 mg Oral TID   azithromycin   500 mg Oral Daily   buprenorphine -naloxone   1 tablet Sublingual Daily   Chlorhexidine  Gluconate Cloth  6 each Topical Daily   enoxaparin  (LOVENOX ) injection  40 mg Subcutaneous Q24H   FLUoxetine   20 mg Oral Daily   guaiFENesin   600 mg Oral BID   ipratropium-albuterol   3 mL Nebulization Q6H   methylPREDNISolone  (SOLU-MEDROL ) injection  60 mg Intravenous Q12H   mupirocin  ointment  1 Application Nasal BID   oseltamivir   75 mg Oral BID   pantoprazole    40 mg Oral Daily   pneumococcal 20-valent conjugate vaccine  0.5 mL Intramuscular Tomorrow-1000   QUEtiapine   100 mg Oral QHS   roflumilast   500 mcg Oral Daily   tamsulosin   0.4 mg Oral Daily   Continuous Infusions:   PRN Meds:.acetaminophen  **OR** acetaminophen , albuterol , HYDROcodone -acetaminophen , ondansetron  **OR** ondansetron  (ZOFRAN ) IV  Antimicrobials: Anti-infectives (From admission, onward)    Start     Dose/Rate Route Frequency Ordered Stop   06/05/23 1245  azithromycin  (ZITHROMAX ) tablet 500 mg        500 mg Oral Daily 06/05/23 1156 06/10/23 0959   06/03/23 2245  cefTRIAXone  (ROCEPHIN ) 1 g in sodium chloride  0.9 % 100 mL IVPB  Status:  Discontinued        1 g 200 mL/hr over 30 Minutes Intravenous Every 24 hours 06/03/23 2233 06/05/23 1156   06/03/23 2245  oseltamivir  (TAMIFLU ) capsule 75 mg        75 mg Oral 2 times daily 06/03/23 2233 06/08/23 2159        I have personally reviewed the following labs and images:   Data Review:    Recent Labs  Lab 06/03/23 1828 06/03/23 2140 06/04/23 0420 06/06/23 0426 06/07/23 0821  WBC 8.5  --  4.8 7.0 6.8  HGB 15.0 13.6 12.5* 12.6* 13.7  HCT 46.6 40.0 37.7* 38.4* 40.9  PLT 156  --  165 181 182  MCV 93.4  --  90.8 91.4 88.7  MCH 30.1  --  30.1 30.0 29.7  MCHC 32.2  --  33.2 32.8 33.5  RDW 13.0  --  12.8 12.9 12.5  LYMPHSABS 0.6*  --   --   --  1.3  MONOABS 0.9  --   --   --  0.5  EOSABS 0.0  --   --   --  0.0  BASOSABS 0.1  --   --   --  0.0    Recent Labs  Lab 06/03/23 1828 06/03/23 1903 06/03/23 2135 06/03/23 2136 06/03/23 2140 06/03/23 2156 06/04/23 0420 06/06/23 0426 06/07/23 0821  NA 135  --  135  --  134*  --  139 139 138  K 5.8*  --  4.2  --  4.1  --  4.0 3.6 4.1  CL 96*  --  98  --   --   --  103 97* 97*  CO2 25  --  23  --   --   --  25 30 30   ANIONGAP 14  --  14  --   --   --  11 12 11   GLUCOSE 100*  --  112*  --   --   --  158* 94 139*  BUN 7*  --  6*  --   --   --  7* 13 10  CREATININE  0.87  --  0.74  --   --   --  0.79 0.75 0.65  AST 36  --   --   --   --   --  21  --   --  ALT 19  --   --   --   --   --  21  --   --   ALKPHOS 65  --   --   --   --   --  50  --   --   BILITOT 1.4*  --   --   --   --   --  0.6  --   --   ALBUMIN 3.9  --   --   --   --   --  3.0* 3.2*  --   CRP  --   --   --   --   --   --   --   --  3.5*  PROCALCITON  --   --  <0.10  --   --   --   --  <0.10  --   LATICACIDVEN  --  2.2*  --  1.8  --  1.5  --   --   --   INR  --   --   --   --   --   --  1.1  --   --   BNP  --   --   --   --   --   --   --  21.9 40.1  MG  --   --  1.9  --   --   --  1.9 1.8 2.2  PHOS  --   --  2.3*  --   --   --  7.9* 3.1 3.5  CALCIUM 9.1  --  8.8*  --   --   --  8.2* 9.0 9.2      Recent Labs  Lab 06/03/23 1828 06/03/23 1903 06/03/23 2135 06/03/23 2136 06/03/23 2156 06/04/23 0420 06/06/23 0426 06/07/23 0821  CRP  --   --   --   --   --   --   --  3.5*  PROCALCITON  --   --  <0.10  --   --   --  <0.10  --   LATICACIDVEN  --  2.2*  --  1.8 1.5  --   --   --   INR  --   --   --   --   --  1.1  --   --   BNP  --   --   --   --   --   --  21.9 40.1  MG  --   --  1.9  --   --  1.9 1.8 2.2  CALCIUM 9.1  --  8.8*  --   --  8.2* 9.0 9.2     Micro Results Recent Results (from the past 240 hours)  Resp panel by RT-PCR (RSV, Flu A&B, Covid) Anterior Nasal Swab     Status: Abnormal   Collection Time: 06/03/23  6:29 PM   Specimen: Anterior Nasal Swab  Result Value Ref Range Status   SARS Coronavirus 2 by RT PCR NEGATIVE NEGATIVE Final   Influenza A by PCR POSITIVE (A) NEGATIVE Final   Influenza B by PCR NEGATIVE NEGATIVE Final    Comment: (NOTE) The Xpert Xpress SARS-CoV-2/FLU/RSV plus assay is intended as an aid in the diagnosis of influenza from Nasopharyngeal swab specimens and should not be used as a sole basis for treatment. Nasal washings and aspirates are unacceptable for Xpert Xpress SARS-CoV-2/FLU/RSV testing.  Fact Sheet for  Patients: bloggercourse.com  Fact Sheet for Healthcare Providers: seriousbroker.it  This  test is not yet approved or cleared by the [place]  FDA and has been authorized for detection and/or diagnosis of SARS-CoV-2 by FDA under an Emergency Use Authorization (EUA). This EUA will remain in effect (meaning this test can be used) for the duration of the COVID-19 declaration under Section 564(b)(1) of the Act, 21 U.S.C. section 360bbb-3(b)(1), unless the authorization is terminated or revoked.     Resp Syncytial Virus by PCR NEGATIVE NEGATIVE Final    Comment: (NOTE) Fact Sheet for Patients: bloggercourse.com  Fact Sheet for Healthcare Providers: seriousbroker.it  This test is not yet approved or cleared by the [place]  FDA and has been authorized for detection and/or diagnosis of SARS-CoV-2 by FDA under an Emergency Use Authorization (EUA). This EUA will remain in effect (meaning this test can be used) for the duration of the COVID-19 declaration under Section 564(b)(1) of the Act, 21 U.S.C. section 360bbb-3(b)(1), unless the authorization is terminated or revoked.  Performed at [facility], [address] [place], [place] [address]   Blood Culture (routine x 2)     Status: None (Preliminary result)   Collection Time: 06/03/23  6:58 PM   Specimen: BLOOD LEFT HAND  Result Value Ref Range Status   Specimen Description BLOOD LEFT HAND  Final   Special Requests   Final    BOTTLES DRAWN AEROBIC ONLY Blood Culture results may not be optimal due to an inadequate volume of blood received in culture bottles   Culture   Final    NO GROWTH 4 DAYS Performed at [facility], [address], [place], [place] [address]    Report Status PENDING  Incomplete  Culture, blood (Routine X 2) w Reflex to ID Panel     Status: None (Preliminary result)   Collection Time:  06/03/23  9:25 PM   Specimen: BLOOD LEFT FOREARM  Result Value Ref Range Status   Specimen Description BLOOD LEFT FOREARM  Final   Special Requests   Final    BOTTLES DRAWN AEROBIC AND ANAEROBIC Blood Culture results may not be optimal due to an inadequate volume of blood received in culture bottles   Culture   Final    NO GROWTH 3 DAYS Performed at [facility], [address], [place], [place] [address]    Report Status PENDING  Incomplete  MRSA Next Gen by PCR, Nasal     Status: Abnormal   Collection Time: 06/04/23  9:22 PM   Specimen: Nasal Mucosa; Nasal Swab  Result Value Ref Range Status   MRSA by PCR Next Gen DETECTED (A) NOT DETECTED Final    Comment: RESULT CALLED TO, READ BACK BY AND VERIFIED WITH: M BURNETTE,RN@[id number]  06/04/23 MK (NOTE) The GeneXpert MRSA Assay (FDA approved for NASAL specimens only), is one component of a comprehensive MRSA colonization surveillance program. It is not intended to diagnose MRSA infection nor to guide or monitor treatment for MRSA infections. Test performance is not FDA approved in patients less than 67 years old. Performed at [facility], [address], [place], [place] [address]   Respiratory (~20 pathogens) panel by PCR     Status: Abnormal   Collection Time: 06/06/23 11:18 AM   Specimen: Nasopharyngeal Swab; Respiratory  Result Value Ref Range Status   Adenovirus NOT DETECTED NOT DETECTED Final   Coronavirus 229E NOT DETECTED NOT DETECTED Final    Comment: (NOTE) The Coronavirus on the Respiratory Panel, DOES NOT test for the novel  Coronavirus (2019  nCoV)    Coronavirus HKU1 NOT DETECTED NOT DETECTED Final   Coronavirus NL63 NOT DETECTED NOT DETECTED Final   Coronavirus OC43 NOT DETECTED NOT DETECTED Final   Metapneumovirus NOT DETECTED NOT DETECTED Final   Rhinovirus / Enterovirus NOT DETECTED NOT DETECTED Final   Influenza A H3 DETECTED (A) NOT DETECTED Final   Influenza B NOT DETECTED NOT DETECTED Final    Parainfluenza Virus 1 NOT DETECTED NOT DETECTED Final   Parainfluenza Virus 2 NOT DETECTED NOT DETECTED Final   Parainfluenza Virus 3 NOT DETECTED NOT DETECTED Final   Parainfluenza Virus 4 NOT DETECTED NOT DETECTED Final   Respiratory Syncytial Virus NOT DETECTED NOT DETECTED Final   Bordetella pertussis NOT DETECTED NOT DETECTED Final   Bordetella Parapertussis NOT DETECTED NOT DETECTED Final   Chlamydophila pneumoniae NOT DETECTED NOT DETECTED Final   Mycoplasma pneumoniae NOT DETECTED NOT DETECTED Final    Comment: Performed at [facility], [address] [place], [place] [address]    Radiology Reports DG Chest [place] 1 View Result Date: 06/06/2023 CLINICAL DATA:  [id number] with shortness of breath, COPD history. EXAM: PORTABLE CHEST 1 VIEW COMPARISON:  Portable chest yesterday at 11:45 p.m., and CTA chest 06/04/2023. FINDINGS: 6:06 a.m. The lungs are emphysematous but clear. No pleural effusion is seen. The cardiomediastinal silhouette and vascular pattern are normal. Osteopenia and prior right shoulder replacement. Compare: Stable COPD chest. IMPRESSION: No evidence of acute chest disease. Emphysema. Electronically Signed   By: Francis Quam M.D.   On: 06/06/2023 06:55   DG CHEST PORT 1 VIEW Result Date: 06/05/2023 CLINICAL DATA:  Shortness of breath EXAM: PORTABLE CHEST 1 VIEW COMPARISON:  06/03/2023 FINDINGS: Heart size and pulmonary vascularity are normal. Emphysematous changes in the lungs. Streaky perihilar infiltrates with peribronchial thickening suggesting airways disease or bronchitis. No developing consolidation or airspace disease. No pleural effusion or pneumothorax. Mediastinal contours appear intact. Linear scarring or atelectasis in the right base. Postoperative change in the right shoulder. IMPRESSION: Perihilar and peribronchial changes likely representing airways disease or bronchitis. Emphysematous changes in the lungs. No developing consolidation. Electronically Signed    By: Elsie Gravely M.D.   On: 06/05/2023 23:51   ECHOCARDIOGRAM COMPLETE Result Date: 06/04/2023    ECHOCARDIOGRAM REPORT   Patient Name:   MAXTON NOREEN Date of Exam: 06/04/2023 Medical Rec #:  [id number]       Height:       69.0 in Accession #:    [id number]      Weight:       185.8 lb Date of Birth:  04/22/1956       BSA:          2.003 m Patient Age:    67 years        BP:           136/99 mmHg Patient Gender: M               HR:           65 bpm. Exam Location:  Inpatient Procedure: 2D Echo, Cardiac Doppler and Color Doppler Indications:    Chest Pain R07.9  History:        Patient has no prior history of Echocardiogram examinations.                 COPD; Signs/Symptoms:Chest Pain.  Sonographer:    Thea Norlander RCS Referring Phys: [phone] ANASTASSIA DOUTOVA  Sonographer Comments: No apical window and suboptimal parasternal window. IMPRESSIONS  1. Left ventricular ejection fraction, by estimation, is 55 to 60%. The left ventricle has normal function. The left ventricle has no regional wall motion abnormalities. Left ventricular diastolic parameters are consistent with Grade I diastolic dysfunction (impaired relaxation).  2. Right ventricular systolic function is low normal. The right ventricular size is normal. There is normal pulmonary artery systolic pressure. The estimated right ventricular systolic pressure is 23.8 mmHg.  3. The mitral valve is abnormal. Trivial mitral valve regurgitation.  4. The aortic valve is tricuspid. Aortic valve regurgitation is not visualized. Aortic valve sclerosis is present, with no evidence of aortic valve stenosis.  5. The inferior vena cava is dilated in size with >50% respiratory variability, suggesting right atrial pressure of 8 mmHg. Comparison(s): No prior Echocardiogram. FINDINGS  Left Ventricle: Left ventricular ejection fraction, by estimation, is 55 to 60%. The left ventricle has normal function. The left ventricle has no regional wall motion abnormalities. The  left ventricular internal cavity size was normal in size. There is  no left ventricular hypertrophy. Left ventricular diastolic parameters are consistent with Grade I diastolic dysfunction (impaired relaxation). Indeterminate filling pressures. Right Ventricle: The right ventricular size is normal. No increase in right ventricular wall thickness. Right ventricular systolic function is low normal. There is normal pulmonary artery systolic pressure. The tricuspid regurgitant velocity is 1.99 m/s,  and with an assumed right atrial pressure of 8 mmHg, the estimated right ventricular systolic pressure is 23.8 mmHg. Left Atrium: Left atrial size was normal in size. Right Atrium: Right atrial size was normal in size. Pericardium: There is no evidence of pericardial effusion. Mitral Valve: The mitral valve is abnormal. Mild mitral annular calcification. Trivial mitral valve regurgitation. Tricuspid Valve: The tricuspid valve is grossly normal. Tricuspid valve regurgitation is trivial. Aortic Valve: The aortic valve is tricuspid. Aortic valve regurgitation is not visualized. Aortic valve sclerosis is present, with no evidence of aortic valve stenosis. Aortic valve peak gradient measures 5.8 mmHg. Pulmonic Valve: The pulmonic valve was not well visualized. Pulmonic valve regurgitation is not visualized. Aorta: The aortic root and ascending aorta are structurally normal, with no evidence of dilitation. Venous: The inferior vena cava is dilated in size with greater than 50% respiratory variability, suggesting right atrial pressure of 8 mmHg. IAS/Shunts: No atrial level shunt detected by color flow Doppler.  LEFT VENTRICLE PLAX 2D LVIDd:         4.90 cm   Diastology LVIDs:         3.10 cm   LV e' medial:    8.38 cm/s LV PW:         1.00 cm   LV E/e' medial:  6.9 LV IVS:        0.90 cm   LV e' lateral:   7.05 cm/s LVOT diam:     2.10 cm   LV E/e' lateral: 8.2 LV SV:         62 LV SV Index:   31 LVOT Area:     3.46 cm  RIGHT  VENTRICLE             IVC RV S prime:     10.90 cm/s  IVC diam: 3.00 cm TAPSE (M-mode): 2.1 cm LEFT ATRIUM           Index        RIGHT ATRIUM           Index LA diam:      4.10 cm 2.05 cm/m   RA Area:  21.30 cm LA Vol (A4C): 47.3 ml 23.62 ml/m  RA Volume:   58.10 ml  29.01 ml/m  AORTIC VALVE AV Area (Vmax): 2.59 cm AV Vmax:        120.00 cm/s AV Peak Grad:   5.8 mmHg LVOT Vmax:      89.90 cm/s LVOT Vmean:     56.100 cm/s LVOT VTI:       0.179 m  AORTA Ao Root diam: 3.50 cm MITRAL VALVE               TRICUSPID VALVE MV Area (PHT): 3.31 cm    TR Peak grad:   15.8 mmHg MV Decel Time: 229 msec    TR Vmax:        199.00 cm/s MV E velocity: 57.60 cm/s MV A velocity: 80.20 cm/s  SHUNTS MV E/A ratio:  0.72        Systemic VTI:  0.18 m                            Systemic Diam: 2.10 cm Vinie Maxcy MD Electronically signed by Vinie Maxcy MD Signature Date/Time: 06/04/2023/1:10:51 PM    Final    CT Angio Chest Pulmonary Embolism (PE) W or WO Contrast Result Date: 06/04/2023 CLINICAL DATA:  Pulmonary embolism suspected EXAM: CT ANGIOGRAPHY CHEST WITH CONTRAST TECHNIQUE: Multidetector CT imaging of the chest was performed using the standard protocol during bolus administration of intravenous contrast. Multiplanar CT image reconstructions and MIPs were obtained to evaluate the vascular anatomy. RADIATION DOSE REDUCTION: This exam was performed according to the departmental dose-optimization program which includes automated exposure control, adjustment of the mA and/or kV according to patient size and/or use of iterative reconstruction technique. CONTRAST:  64mL OMNIPAQUE  IOHEXOL  350 MG/ML SOLN COMPARISON:  05/31/2022 FINDINGS: Cardiovascular: Contrast injection is sufficient to demonstrate satisfactory opacification of the pulmonary arteries to the segmental level. There is no pulmonary embolus or evidence of right heart strain. The size of the main pulmonary artery is normal. Heart size is normal, with no  pericardial effusion. The course and caliber of the aorta are normal. There is no atherosclerotic calcification. Opacification decreased due to pulmonary arterial phase contrast bolus timing. Mediastinum/Nodes: No mediastinal, hilar or axillary lymphadenopathy. Normal visualized thyroid. Thoracic esophageal course is normal. Lungs/Pleura: Airways are patent. No pleural effusion, lobar consolidation, pneumothorax or pulmonary infarction. Upper Abdomen: Contrast bolus timing is not optimized for evaluation of the abdominal organs. The visualized portions of the organs of the upper abdomen are normal. Musculoskeletal: No chest wall abnormality. No bony spinal canal stenosis. Review of the MIP images confirms the above findings. IMPRESSION: No pulmonary embolus or acute intrathoracic abnormality. Electronically Signed   By: Franky Stanford M.D.   On: 06/04/2023 01:31   DG Chest Port 1 View Result Date: 06/03/2023 CLINICAL DATA:  Sepsis EXAM: PORTABLE CHEST 1 VIEW COMPARISON:  03/23/2023, CT 05/31/2022 FINDINGS: Focal opacity at the right cardiophrenic angle relates to prominent epicardial fat better seen on prior CT examination. The lungs are symmetrically well expanded. There has developed progressive interstitial thickening which may relate to developing airway inflammation. Moreover, there is new multifocal nodular opacity within the left upper lobe, likely infectious or inflammatory in nature. No pneumothorax or pleural effusion. Cardiac size within normal limits. Pulmonary vascularity is normal. No acute bone. IMPRESSION: 1. Progressive interstitial thickening which may relate to developing airway inflammation. 2. New multifocal nodular opacity within the left upper lobe, likely infectious  or inflammatory in nature. A follow-up two view chest radiograph is recommended in 3-4 weeks to document resolution. Electronically Signed   By: Dorethia Molt M.D.   On: 06/03/2023 19:18      Signature  -   Brayton Lye  M.D on 06/07/2023 at 10:30 AM   -  To page go to [email or web address]

## 2023-06-07 NOTE — Plan of Care (Signed)
 Pt has rested quietly throughout the night with no distress noted. Alert and oriented. On 10L HFNC. SR on the  monitor. Voids per urinal. No complaints voiced.     Problem: Coping: Goal: Level of anxiety will decrease Outcome: Progressing   Problem: Pain Managment: Goal: General experience of comfort will improve and/or be controlled Outcome: Progressing   Problem: Activity: Goal: Ability to tolerate increased activity will improve Outcome: Progressing Goal: Will verbalize the importance of balancing activity with adequate rest periods Outcome: Progressing   Problem: Respiratory: Goal: Levels of oxygenation will improve Outcome: Progressing Goal: Ability to maintain adequate ventilation will improve Outcome: Progressing

## 2023-06-07 NOTE — Progress Notes (Signed)
 Physical Therapy Treatment Patient Details Name: Chris [name] MRN: [id number] DOB: Aug 30, 1955 Today's Date: 06/07/2023   History of Present Illness Pt is 68 yo male who presents on 06/03/23 with worsening SOB in setting of COPD. Pt positive for flu A. PMH: COPD, chronic hypoxia on 3-4 L O2, anxiety    PT Comments  Pt received in supine and agreeable to session. Pt demonstrates significant DOE with all mobility requiring increased time and frequent rest breaks. Pt able to tolerate 2 short gait trials in the room with use of RW during second and pt reports preferring use of RW. Pt expressed concerns about returning home with current level of DOE and limited mobility tolerance. Pt on 6L throughout session and desats as low as 87% during mobility. Pt continues to benefit from PT services to progress toward functional mobility goals.    If plan is discharge home, recommend the following: Assist for transportation;Assistance with cooking/housework   Can travel by private vehicle        Equipment Recommendations  None recommended by PT    Recommendations for Other Services       Precautions / Restrictions Precautions Precautions: None Precaution Comments: O2 sats Restrictions Weight Bearing Restrictions Per Provider Order: No     Mobility  Bed Mobility Overal bed mobility: Modified Independent                  Transfers Overall transfer level: Needs assistance Equipment used: None Transfers: Sit to/from Stand Sit to Stand: Supervision           General transfer comment: From EOB and recliner    Ambulation/Gait Ambulation/Gait assistance: Contact guard assist Gait Distance (Feet): 20 Feet (+10) Assistive device: None, Rolling walker (2 wheels) Gait Pattern/deviations: Step-through pattern, Trunk flexed Gait velocity: decreased     General Gait Details: increased instability without UE support.  Use of RW for balance and energy conservation. limited by  DOE       Balance Overall balance assessment: Mild deficits observed, not formally tested                                          Cognition Arousal: Alert Behavior During Therapy: [facility] for tasks assessed/performed Overall Cognitive Status: Within Functional Limits for tasks assessed                                          Exercises      General Comments General comments (skin integrity, edema, etc.): Pt on 6L with DOE with all mobility and requires increased time for recovery. SpO2 as low as 87% and cues for pursed lip breathing      Pertinent Vitals/Pain Pain Assessment Pain Assessment: No/denies pain     PT Goals (current goals can now be found in the care plan section) Acute Rehab PT Goals Patient Stated Goal: return home PT Goal Formulation: With patient Time For Goal Achievement: 06/19/23 Progress towards PT goals: Progressing toward goals    Frequency    Min 1X/week       AM-PAC PT 6 Clicks Mobility   Outcome Measure  Help needed turning from your back to your side while in a flat bed without using bedrails?: None Help needed moving from lying on your back to sitting on the  side of a flat bed without using bedrails?: None Help needed moving to and from a bed to a chair (including a wheelchair)?: A Little Help needed standing up from a chair using your arms (e.g., wheelchair or bedside chair)?: A Little Help needed to walk in hospital room?: A Little Help needed climbing 3-5 steps with a railing? : A Lot 6 Click Score: 19    End of Session Equipment Utilized During Treatment: Oxygen Activity Tolerance: Patient limited by fatigue Patient left: in bed;with call bell/phone within reach Nurse Communication: Mobility status PT Visit Diagnosis: Difficulty in walking, not elsewhere classified (R26.2)     Time: 8656-8571 PT Time Calculation (min) (ACUTE ONLY): 45 min  Charges:    $Gait Training: 23-37  mins $Therapeutic Activity: 8-22 mins PT General Charges $$ ACUTE PT VISIT: 1 Visit                     Darryle George, PTA Acute Rehabilitation Services Secure Chat Preferred  [phone]    Darryle George 06/07/2023, 2:34 PM

## 2023-06-08 ENCOUNTER — Other Ambulatory Visit (HOSPITAL_COMMUNITY): Payer: Self-pay

## 2023-06-08 DIAGNOSIS — J441 Chronic obstructive pulmonary disease with (acute) exacerbation: Secondary | ICD-10-CM | POA: Diagnosis not present

## 2023-06-08 LAB — BASIC METABOLIC PANEL
Anion gap: 12 (ref 5–15)
BUN: 15 mg/dL (ref 8–23)
CO2: 30 mmol/L (ref 22–32)
Calcium: 9.3 mg/dL (ref 8.9–10.3)
Chloride: 98 mmol/L (ref 98–111)
Creatinine, Ser: 0.8 mg/dL (ref 0.61–1.24)
GFR, Estimated: >60 mL/min (ref >60)
Glucose, Bld: 143 mg/dL — ABNORMAL HIGH (ref 70–99)
Potassium: 4.4 mmol/L (ref 3.5–5.1)
Sodium: 140 mmol/L (ref 135–145)

## 2023-06-08 LAB — CBC WITH DIFFERENTIAL/PLATELET
Abs Immature Granulocytes: 0.03 10*3/uL (ref 0.00–0.07)
Basophils Absolute: 0 10*3/uL (ref 0.0–0.1)
Basophils Relative: 0 %
Eosinophils Absolute: 0 10*3/uL (ref 0.0–0.5)
Eosinophils Relative: 0 %
HCT: 40.8 % (ref 39.0–52.0)
Hemoglobin: 13.4 g/dL (ref 13.0–17.0)
Immature Granulocytes: 1 %
Lymphocytes Relative: 15 %
Lymphs Abs: 1 10*3/uL (ref 0.7–4.0)
MCH: 29.6 pg (ref 26.0–34.0)
MCHC: 32.8 g/dL (ref 30.0–36.0)
MCV: 90.1 fL (ref 80.0–100.0)
Monocytes Absolute: 0.3 10*3/uL (ref 0.1–1.0)
Monocytes Relative: 4 %
Neutro Abs: 5.1 10*3/uL (ref 1.7–7.7)
Neutrophils Relative %: 80 %
Platelets: 156 10*3/uL (ref 150–400)
RBC: 4.53 MIL/uL (ref 4.22–5.81)
RDW: 12.6 % (ref 11.5–15.5)
Smear Review: NORMAL
WBC: 6.4 10*3/uL (ref 4.0–10.5)
nRBC: 0 % (ref 0.0–0.2)

## 2023-06-08 LAB — BRAIN NATRIURETIC PEPTIDE: B Natriuretic Peptide: 28.1 pg/mL (ref 0.0–100.0)

## 2023-06-08 LAB — CULTURE, BLOOD (ROUTINE X 2): Culture: NO GROWTH

## 2023-06-08 LAB — PROCALCITONIN: Procalcitonin: <0.1 ng/mL

## 2023-06-08 LAB — PHOSPHORUS: Phosphorus: 2.9 mg/dL (ref 2.5–4.6)

## 2023-06-08 LAB — C-REACTIVE PROTEIN: CRP: 1.5 mg/dL — ABNORMAL HIGH (ref <1.0)

## 2023-06-08 LAB — MAGNESIUM: Magnesium: 2.2 mg/dL (ref 1.7–2.4)

## 2023-06-08 MED ORDER — DOXYCYCLINE HYCLATE 100 MG PO TABS
100.0000 mg | ORAL_TABLET | Freq: Two times a day (BID) | ORAL | 0 refills | Status: AC
  Filled 2023-06-08: qty 10, 5d supply, fill #0

## 2023-06-08 MED ORDER — PREDNISONE 5 MG PO TABS: 5.0000 mg | ORAL_TABLET | Freq: Every day | ORAL | Status: AC

## 2023-06-08 MED ORDER — PREDNISONE 10 MG PO TABS
ORAL_TABLET | ORAL | 0 refills | Status: AC
  Filled 2023-06-08: qty 30, 12d supply, fill #0

## 2023-06-08 MED ORDER — PANTOPRAZOLE SODIUM 40 MG PO TBEC
40.0000 mg | DELAYED_RELEASE_TABLET | Freq: Every day | ORAL | 0 refills | Status: AC
  Filled 2023-06-08: qty 30, 30d supply, fill #0

## 2023-06-08 NOTE — Progress Notes (Signed)
 MD called d/t pt was weaned to 5 lpm [place] by MD.  Currently sat 92%, no distress noted.  Pt currently appears to be tol 5 lpm [place] well currently.

## 2023-06-08 NOTE — Plan of Care (Signed)
 Pt has rested quietly throughout the night with no distress noted. Alert and oriented. O2 15LHFNC. SR/SB on the monitor. Voids per urinal. No complaints voiced.     Problem: Pain Managment: Goal: General experience of comfort will improve and/or be controlled Outcome: Progressing   Problem: Education: Goal: Knowledge of disease or condition will improve Outcome: Progressing   Problem: Activity: Goal: Ability to tolerate increased activity will improve Outcome: Progressing   Problem: Respiratory: Goal: Levels of oxygenation will improve Outcome: Progressing Goal: Ability to maintain adequate ventilation will improve Outcome: Progressing

## 2023-06-08 NOTE — Progress Notes (Signed)
 Found HFNC on 13 lpm, sat was 98%.  Weaned to 10 lpm, sat 97%.  No distress noted.

## 2023-06-08 NOTE — Discharge Instructions (Signed)
 [facility] with [facility] in 7 days   Get CBC, CMP, 2 view Chest X ray checked  by Primary MD next visit.    Activity: As tolerated with Full fall precautions use walker/cane & assistance as needed   Disposition Home    Diet: Heart Healthy   On your next visit with your primary care physician please Get Medicines reviewed and adjusted.   Please request your Prim.MD to go over all Hospital Tests and Procedure/Radiological results at the follow up, please get all Hospital records sent to your Prim MD by signing hospital release before you go home.   If you experience worsening of your admission symptoms, develop shortness of breath, life threatening emergency, suicidal or homicidal thoughts you must seek medical attention immediately by calling 911 or calling your MD immediately  if symptoms less severe.  You Must read complete instructions/literature along with all the possible adverse reactions/side effects for all the Medicines you take and that have been prescribed to you. Take any new Medicines after you have completely understood and accpet all the possible adverse reactions/side effects.   Do not drive, operating heavy machinery, perform activities at heights, swimming or participation in water activities or provide baby sitting services if your were admitted for syncope or siezures until you have seen by Primary MD or a Neurologist and advised to do so again.  Do not drive when taking Pain medications.    Do not take more than prescribed Pain, Sleep and Anxiety Medications  Special Instructions: If you have smoked or chewed Tobacco  in the last 2 yrs please stop smoking, stop any regular Alcohol  and or any Recreational drug use.  Wear Seat belts while driving.   Please note  You were cared for by a hospitalist during your hospital stay. If you have any questions about your discharge medications or the care you received while you were in the  hospital after you are discharged, you can call the unit and asked to speak with the hospitalist on call if the hospitalist that took care of you is not available. Once you are discharged, your primary care physician will handle any further medical issues. Please note that NO REFILLS for any discharge medications will be authorized once you are discharged, as it is imperative that you return to your primary care physician (or establish a relationship with a primary care physician if you do not have one) for your aftercare needs so that they can reassess your need for medications and monitor your lab values.

## 2023-06-08 NOTE — TOC Transition Note (Signed)
 Transition of Care [facility]) - Discharge Note   Patient Details  Name: Chris [name] MRN: [id number] Date of Birth: 1955-05-07  [facility] of [facility] CM/SW Contact:  Andrez JULIANNA George, RN Phone Number: 06/08/2023, 9:59 AM   Clinical Narrative:     Pt is discharging home with self care. Bedside RN to make sure family brings oxygen for transport home.  No follow up per therapies.  Pt requesting to have new NIV at home. CM has updated [facility] and they will send the forms to Dr Mardee office for signing. Pt will need to follow up with Dr Mardee office and [facility] pt about receiving the NIV. Pt has transport home per his girlfriend. She will bring oxygen for transport.   Final next level of care: Home/Self Care Barriers to Discharge: No Barriers Identified   Patient Goals and CMS Choice            Discharge Placement                       Discharge Plan and Services Additional resources added to the After Visit Summary for                                       Social Drivers of Health (SDOH) Interventions SDOH Screenings   Food Insecurity: No Food Insecurity (06/05/2023)  Housing: Low Risk  (06/05/2023)  Transportation Needs: No Transportation Needs (06/05/2023)  Utilities: Not At Risk (06/05/2023)  Social Connections: Socially Isolated (06/05/2023)  Tobacco Use: Medium Risk (06/03/2023)     Readmission Risk Interventions     No data to display

## 2023-06-08 NOTE — Plan of Care (Signed)
 Patient discharged for home. Discharge education done with emphasis on medication adherence and follow up appointments. Patient voice that he understands same and feels ready and ok to go home.                                                                        Problem: Education: Goal: Knowledge of General Education information will improve Description: Including pain rating scale, medication(s)/side effects and non-pharmacologic comfort measures Outcome: Adequate for Discharge   Problem: Health Behavior/Discharge Planning: Goal: Ability to manage health-related needs will improve Outcome: Adequate for Discharge   Problem: Clinical Measurements: Goal: Ability to maintain clinical measurements within normal limits will improve Outcome: Adequate for Discharge Goal: Will remain free from infection Outcome: Adequate for Discharge Goal: Diagnostic test results will improve Outcome: Adequate for Discharge Goal: Respiratory complications will improve Outcome: Adequate for Discharge Goal: Cardiovascular complication will be avoided Outcome: Adequate for Discharge   Problem: Activity: Goal: Risk for activity intolerance will decrease Outcome: Adequate for Discharge   Problem: Nutrition: Goal: Adequate nutrition will be maintained Outcome: Adequate for Discharge   Problem: Coping: Goal: Level of anxiety will decrease Outcome: Adequate for Discharge   Problem: Elimination: Goal: Will not experience complications related to bowel motility Outcome: Adequate for Discharge Goal: Will not experience complications related to urinary retention Outcome: Adequate for Discharge   Problem: Pain Managment: Goal: General experience of comfort will improve and/or be controlled Outcome: Adequate for Discharge   Problem: Safety: Goal: Ability to remain free from injury will improve Outcome: Adequate for Discharge   Problem: Skin Integrity: Goal: Risk for impaired skin integrity will  decrease Outcome: Adequate for Discharge   Problem: Education: Goal: Knowledge of disease or condition will improve Outcome: Adequate for Discharge Goal: Knowledge of the prescribed therapeutic regimen will improve Outcome: Adequate for Discharge Goal: Individualized Educational Video(s) Outcome: Adequate for Discharge   Problem: Activity: Goal: Ability to tolerate increased activity will improve Outcome: Adequate for Discharge Goal: Will verbalize the importance of balancing activity with adequate rest periods Outcome: Adequate for Discharge   Problem: Respiratory: Goal: Ability to maintain a clear airway will improve Outcome: Adequate for Discharge Goal: Levels of oxygenation will improve Outcome: Adequate for Discharge Goal: Ability to maintain adequate ventilation will improve Outcome: Adequate for Discharge

## 2023-06-08 NOTE — Progress Notes (Signed)
 OT Cancellation Note  Patient Details Name: Chris [name] MRN: [id number] DOB: [birth year]   Cancelled Treatment:    Reason Eval/Treat Not Completed: (P) OT screened, no needs identified, will sign off. Pt states he wants to save his energy for returning home at noon, gets out of breath very easily. Pt reports feeling better than past couple of days, and has help from significant other at home as needed, has all DME needed to remain safe/independent, no OT follow up. If for some reason Pt stays will continue to see acutely.  [name] 06/08/2023, 10:28 AM

## 2023-06-08 NOTE — Discharge Summary (Addendum)
 Physician Discharge Summary  Chris [name] FMW:[id number] DOB: [birth year] DOA: 06/03/2023  PCP: [facility]  Admit date: 06/03/2023 Discharge date: 06/08/2023  Admitted From: (Home) Disposition:  (Home)  Recommendations for Outpatient Follow-up:  Follow up with PCP in 1-2 weeks Please obtain BMP/CBC in one week CT chest with IV contrast has been obtained during hospital stay, patient has been provided by compact disc to take his pulmonary for review Patient expresses interest in using his NIV again, so his home health will fax the form to his pulmonary doctor to see if it can be reinstated. Patient with significant anxiety, contributing to his dyspneic episodes.  Diet recommendation: Heart Healthy   Brief/Interim Summary:  68 y.o. male with medical history significant of COPD, chronic hypoxia on 4-5L, anxiety, presented with worsening shortness of breath . He was tested positive for influenza A.  Patient was started on Tamiflu , steroid and breathing treatments and admitted for further care.  Oxygen requirement at baseline but continues to endorse respiratory distress.    Procedures:  CT angiogram chest.  No PE   Echocardiogram.  Preserved EF 55 to 60%.  Grade 1 diastolic dysfunction    Acute on chronic hypoxic respiratory failure in a patient with advanced underlying COPD who was on 4 to 5 L nasal cannula oxygen at baseline, brought on by acute influenza A infection.     Patient presents with worsening dyspnea, tested positive for influenza A.  CT angio chest negative for PE or acute intrathoracic abnormality.  Procalcitonin negative.  Patient has been treated with Tamiflu  during hospital stay, given COPD since admission he was treated with azithromycin , he was treated with IV steroids as well . -Patient with events where he was trying to stand up by himself go to the bathroom without using his oxygen, he was in significant respiratory distress after that requiring brief  BiPAP support, he is currently at baseline, . -Tolerating 5 L nasal cannula . -Patient with significant anxiety, contributing to these episodes of respiratory distress .  And subjective dyspnea -Patient used to have an NIV in the past, but has been [employer], [employer] by his the [employer] as he was not using it, he expressed interest in that, so TOC discussed with home health agency I will resend the form to his pulmonary doctor to see if it can be resumed as an outpatient. -He is to be discharged on another 5 days of p.o. doxycycline , prolonged p.o. prednisone  taper till he gets back to baseline of 5 mg of prednisone  daily, he will be provided Protonix  for GI prophylaxis as well, he finished his Tamiflu  during hospital stay, to continue his home inhalers, he is having nebulizer machine at home already. -Patient on low-dose Toprol-XL, overall blood pressure has been controlled during hospital stay, actually on the soft side, and there was no tachycardia, so it was held during hospital stay, as well this has been discontinued at time of discharge especially with his significant COPD.  Addendum: -Patient with an episode dyspnea, diminished air entry and requiring more oxygen upon using the urinal, around 11 AM, he received nebulizer treatment, he was seen and assessed few times after that, currently stable on 5 L oxygen, I have discussed with him and significant other at bedside, use nebulizer treatment when he ever having any of these dyspneic event, discussed with him, he feels fine, at baseline, okay to go home today.   Chronic respiratory failure with hypoxia: Reports using 4 to 5 L at baseline.  Kindly  see above, quit smoking 2 years ago.  Follows with Dr. Lindle who is his pulmonologist.   chronic diastolic CHF EF 55%.   -Compensated, no acute exacerbation during hospital stay   Chronic pain: Uses Suboxone  and Flexeril at home. Continue home Suboxone    Atypical chest pain: Resolved.  CTA chest negative  for PE or acute intrathoracic finding.   Physical deconditioning  -PT/OT consulted during hospital stay   Anxiety -Continue dual and as needed benzodiazepines.   Discharge Diagnoses:  Principal Problem:   COPD exacerbation (HCC) Active Problems:   Influenza A   Acute on chronic hypoxic respiratory failure (HCC)   Chest pain    Discharge Instructions  Discharge Instructions     Diet - low sodium heart healthy   Complete by: As directed    Discharge instructions   Complete by: As directed    [facility] in 7 days   Get CBC, CMP, 2 view Chest X ray checked  by Primary MD next visit.    Activity: As tolerated with Full fall precautions use walker/cane & assistance as needed   Disposition Home    Diet: Heart Healthy   On your next visit with your primary care physician please Get Medicines reviewed and adjusted.   Please request your Prim.MD to go over all Hospital Tests and Procedure/Radiological results at the follow up, please get all Hospital records sent to your Prim MD by signing hospital release before you go home.   If you experience worsening of your admission symptoms, develop shortness of breath, life threatening emergency, suicidal or homicidal thoughts you must seek medical attention immediately by calling 911 or calling your MD immediately  if symptoms less severe.  You Must read complete instructions/literature along with all the possible adverse reactions/side effects for all the Medicines you take and that have been prescribed to you. Take any new Medicines after you have completely understood and accpet all the possible adverse reactions/side effects.   Do not drive, operating heavy machinery, perform activities at heights, swimming or participation in water activities or provide baby sitting services if your were admitted for syncope or siezures until you have seen by Primary MD or a Neurologist and advised to do so  again.  Do not drive when taking Pain medications.    Do not take more than prescribed Pain, Sleep and Anxiety Medications  Special Instructions: If you have smoked or chewed Tobacco  in the last 2 yrs please stop smoking, stop any regular Alcohol  and or any Recreational drug use.  Wear Seat belts while driving.   Please note  You were cared for by a hospitalist during your hospital stay. If you have any questions about your discharge medications or the care you received while you were in the hospital after you are discharged, you can call the unit and asked to speak with the hospitalist on call if the hospitalist that took care of you is not available. Once you are discharged, your primary care physician will handle any further medical issues. Please note that NO REFILLS for any discharge medications will be authorized once you are discharged, as it is imperative that you return to your primary care physician (or establish a relationship with a primary care physician if you do not have one) for your aftercare needs so that they can reassess your need for medications and monitor your lab values.   Increase activity slowly   Complete by: As directed  Allergies as of 06/08/2023   No Known Allergies      Medication List     STOP taking these medications    metoprolol succinate 25 MG 24 hr tablet Commonly known as: TOPROL-XL       TAKE these medications    albuterol  108 (90 Base) MCG/ACT inhaler Commonly known as: VENTOLIN  HFA Inhale 2 puffs into the lungs every 4 (four) hours as needed for wheezing or shortness of breath.   albuterol  (2.5 MG/3ML) 0.083% nebulizer solution Commonly known as: PROVENTIL  Take 2.5 mg by nebulization every 6 (six) hours as needed for shortness of breath or wheezing.   ALPRAZolam  1 MG tablet Commonly known as: XANAX  Take 1 mg by mouth 3 (three) times daily.   Buprenorphine  HCl-Naloxone  HCl 8-2 MG Film Place under the tongue. Place 1/4 of  film under the tongue daily   cyclobenzaprine 10 MG tablet Commonly known as: FLEXERIL Take 10 mg by mouth 3 (three) times daily as needed for muscle spasms.   doxycycline  100 MG tablet Commonly known as: VIBRA -TABS Take 1 tablet (100 mg total) by mouth 2 (two) times daily for 5 days.   FLUoxetine  20 MG capsule Commonly known as: PROZAC  Take 20 mg by mouth daily.   pantoprazole  40 MG tablet Commonly known as: Protonix  Take 1 tablet (40 mg total) by mouth daily.   predniSONE  10 MG tablet Commonly known as: DELTASONE  Take 4 tablets (40 mg total) by mouth daily for 3 days, THEN 3 tablets (30 mg total) daily for 3 days, THEN 2 tablets (20 mg total) daily for 3 days, THEN 1 tablet (10 mg total) daily for 3 days, THEN back to home dose of 5 mg daily Start taking on: June 08, 2023 What changed: You were already taking a medication with the same name, and this prescription was added. Make sure you understand how and when to take each.   predniSONE  5 MG tablet Commonly known as: DELTASONE  Take 1 tablet (5 mg total) by mouth daily. Resume home dose after finishing steroid taper Start taking on: June 20, 2023 What changed:  additional instructions These instructions start on June 20, 2023. If you are unsure what to do until then, ask your doctor or other care provider.   QUEtiapine  100 MG tablet Commonly known as: SEROQUEL  Take 100 mg by mouth at bedtime.   roflumilast  500 MCG Tabs tablet Commonly known as: DALIRESP  Take 500 mcg by mouth daily.   tamsulosin  0.4 MG Caps capsule Commonly known as: FLOMAX  Take 0.4 mg by mouth daily.   Trelegy Ellipta 100-62.5-25 MCG/ACT Aepb Generic drug: Fluticasone-Umeclidin-Vilant Inhale 1 puff into the lungs daily.        Follow-up Information     Chodri, Tanvir, MD Follow up.   Specialty: Specialist Contact information: [address] [place] [address] [phone]                No Known  Allergies  Consultations: PCCM   Procedures/Studies: DG Chest Port 1 View Result Date: 06/06/2023 CLINICAL DATA:  [id number] with shortness of breath, COPD history. EXAM: PORTABLE CHEST 1 VIEW COMPARISON:  Portable chest yesterday at 11:45 p.m., and CTA chest 06/04/2023. FINDINGS: 6:06 a.m. The lungs are emphysematous but clear. No pleural effusion is seen. The cardiomediastinal silhouette and vascular pattern are normal. Osteopenia and prior right shoulder replacement. Compare: Stable COPD chest. IMPRESSION: No evidence of acute chest disease. Emphysema. Electronically Signed   By: Francis Quam M.D.   On:  06/06/2023 06:55   DG CHEST PORT 1 VIEW Result Date: 06/05/2023 CLINICAL DATA:  Shortness of breath EXAM: PORTABLE CHEST 1 VIEW COMPARISON:  06/03/2023 FINDINGS: Heart size and pulmonary vascularity are normal. Emphysematous changes in the lungs. Streaky perihilar infiltrates with peribronchial thickening suggesting airways disease or bronchitis. No developing consolidation or airspace disease. No pleural effusion or pneumothorax. Mediastinal contours appear intact. Linear scarring or atelectasis in the right base. Postoperative change in the right shoulder. IMPRESSION: Perihilar and peribronchial changes likely representing airways disease or bronchitis. Emphysematous changes in the lungs. No developing consolidation. Electronically Signed   By: Elsie Gravely M.D.   On: 06/05/2023 23:51   ECHOCARDIOGRAM COMPLETE Result Date: 06/04/2023    ECHOCARDIOGRAM REPORT   Patient Name:   Chris [name] Date of Exam: 06/04/2023 Medical Rec #:  [id number]       Height:       69.0 in Accession #:    [id number]      Weight:       185.8 lb Date of Birth:  09/07/1955       BSA:          2.003 m Patient Age:    67 years        BP:           136/99 mmHg Patient Gender: M               HR:           65 bpm. Exam Location:  Inpatient Procedure: 2D Echo, Cardiac Doppler and Color Doppler Indications:    Chest Pain R07.9   History:        Patient has no prior history of Echocardiogram examinations.                 COPD; Signs/Symptoms:Chest Pain.  Sonographer:    Thea Norlander RCS Referring Phys: [phone] ANASTASSIA DOUTOVA  Sonographer Comments: No apical window and suboptimal parasternal window. IMPRESSIONS  1. Left ventricular ejection fraction, by estimation, is 55 to 60%. The left ventricle has normal function. The left ventricle has no regional wall motion abnormalities. Left ventricular diastolic parameters are consistent with Grade I diastolic dysfunction (impaired relaxation).  2. Right ventricular systolic function is low normal. The right ventricular size is normal. There is normal pulmonary artery systolic pressure. The estimated right ventricular systolic pressure is 23.8 mmHg.  3. The mitral valve is abnormal. Trivial mitral valve regurgitation.  4. The aortic valve is tricuspid. Aortic valve regurgitation is not visualized. Aortic valve sclerosis is present, with no evidence of aortic valve stenosis.  5. The inferior vena cava is dilated in size with >50% respiratory variability, suggesting right atrial pressure of 8 mmHg. Comparison(s): No prior Echocardiogram. FINDINGS  Left Ventricle: Left ventricular ejection fraction, by estimation, is 55 to 60%. The left ventricle has normal function. The left ventricle has no regional wall motion abnormalities. The left ventricular internal cavity size was normal in size. There is  no left ventricular hypertrophy. Left ventricular diastolic parameters are consistent with Grade I diastolic dysfunction (impaired relaxation). Indeterminate filling pressures. Right Ventricle: The right ventricular size is normal. No increase in right ventricular wall thickness. Right ventricular systolic function is low normal. There is normal pulmonary artery systolic pressure. The tricuspid regurgitant velocity is 1.99 m/s,  and with an assumed right atrial pressure of 8 mmHg, the estimated right  ventricular systolic pressure is 23.8 mmHg. Left Atrium: Left atrial size was normal in size. Right Atrium: Right  atrial size was normal in size. Pericardium: There is no evidence of pericardial effusion. Mitral Valve: The mitral valve is abnormal. Mild mitral annular calcification. Trivial mitral valve regurgitation. Tricuspid Valve: The tricuspid valve is grossly normal. Tricuspid valve regurgitation is trivial. Aortic Valve: The aortic valve is tricuspid. Aortic valve regurgitation is not visualized. Aortic valve sclerosis is present, with no evidence of aortic valve stenosis. Aortic valve peak gradient measures 5.8 mmHg. Pulmonic Valve: The pulmonic valve was not well visualized. Pulmonic valve regurgitation is not visualized. Aorta: The aortic root and ascending aorta are structurally normal, with no evidence of dilitation. Venous: The inferior vena cava is dilated in size with greater than 50% respiratory variability, suggesting right atrial pressure of 8 mmHg. IAS/Shunts: No atrial level shunt detected by color flow Doppler.  LEFT VENTRICLE PLAX 2D LVIDd:         4.90 cm   Diastology LVIDs:         3.10 cm   LV e' medial:    8.38 cm/s LV PW:         1.00 cm   LV E/e' medial:  6.9 LV IVS:        0.90 cm   LV e' lateral:   7.05 cm/s LVOT diam:     2.10 cm   LV E/e' lateral: 8.2 LV SV:         62 LV SV Index:   31 LVOT Area:     3.46 cm  RIGHT VENTRICLE             IVC RV S prime:     10.90 cm/s  IVC diam: 3.00 cm TAPSE (M-mode): 2.1 cm LEFT ATRIUM           Index        RIGHT ATRIUM           Index LA diam:      4.10 cm 2.05 cm/m   RA Area:     21.30 cm LA Vol (A4C): 47.3 ml 23.62 ml/m  RA Volume:   58.10 ml  29.01 ml/m  AORTIC VALVE AV Area (Vmax): 2.59 cm AV Vmax:        120.00 cm/s AV Peak Grad:   5.8 mmHg LVOT Vmax:      89.90 cm/s LVOT Vmean:     56.100 cm/s LVOT VTI:       0.179 m  AORTA Ao Root diam: 3.50 cm MITRAL VALVE               TRICUSPID VALVE MV Area (PHT): 3.31 cm    TR Peak grad:   15.8  mmHg MV Decel Time: 229 msec    TR Vmax:        199.00 cm/s MV E velocity: 57.60 cm/s MV A velocity: 80.20 cm/s  SHUNTS MV E/A ratio:  0.72        Systemic VTI:  0.18 m                            Systemic Diam: 2.10 cm Vinie Maxcy MD Electronically signed by Vinie Maxcy MD Signature Date/Time: 06/04/2023/1:10:51 PM    Final    CT Angio Chest Pulmonary Embolism (PE) W or WO Contrast Result Date: 06/04/2023 CLINICAL DATA:  Pulmonary embolism suspected EXAM: CT ANGIOGRAPHY CHEST WITH CONTRAST TECHNIQUE: Multidetector CT imaging of the chest was performed using the standard protocol during bolus administration of intravenous contrast. Multiplanar CT image reconstructions  and MIPs were obtained to evaluate the vascular anatomy. RADIATION DOSE REDUCTION: This exam was performed according to the departmental dose-optimization program which includes automated exposure control, adjustment of the mA and/or kV according to patient size and/or use of iterative reconstruction technique. CONTRAST:  64mL OMNIPAQUE  IOHEXOL  350 MG/ML SOLN COMPARISON:  05/31/2022 FINDINGS: Cardiovascular: Contrast injection is sufficient to demonstrate satisfactory opacification of the pulmonary arteries to the segmental level. There is no pulmonary embolus or evidence of right heart strain. The size of the main pulmonary artery is normal. Heart size is normal, with no pericardial effusion. The course and caliber of the aorta are normal. There is no atherosclerotic calcification. Opacification decreased due to pulmonary arterial phase contrast bolus timing. Mediastinum/Nodes: No mediastinal, hilar or axillary lymphadenopathy. Normal visualized thyroid. Thoracic esophageal course is normal. Lungs/Pleura: Airways are patent. No pleural effusion, lobar consolidation, pneumothorax or pulmonary infarction. Upper Abdomen: Contrast bolus timing is not optimized for evaluation of the abdominal organs. The visualized portions of the organs of the upper  abdomen are normal. Musculoskeletal: No chest wall abnormality. No bony spinal canal stenosis. Review of the MIP images confirms the above findings. IMPRESSION: No pulmonary embolus or acute intrathoracic abnormality. Electronically Signed   By: Franky Stanford M.D.   On: 06/04/2023 01:31   DG Chest Port 1 View Result Date: 06/03/2023 CLINICAL DATA:  Sepsis EXAM: PORTABLE CHEST 1 VIEW COMPARISON:  03/23/2023, CT 05/31/2022 FINDINGS: Focal opacity at the right cardiophrenic angle relates to prominent epicardial fat better seen on prior CT examination. The lungs are symmetrically well expanded. There has developed progressive interstitial thickening which may relate to developing airway inflammation. Moreover, there is new multifocal nodular opacity within the left upper lobe, likely infectious or inflammatory in nature. No pneumothorax or pleural effusion. Cardiac size within normal limits. Pulmonary vascularity is normal. No acute bone. IMPRESSION: 1. Progressive interstitial thickening which may relate to developing airway inflammation. 2. New multifocal nodular opacity within the left upper lobe, likely infectious or inflammatory in nature. A follow-up two view chest radiograph is recommended in 3-4 weeks to document resolution. Electronically Signed   By: Dorethia Molt M.D.   On: 06/03/2023 19:18      Subjective: No significant events overnight as discussed with staff, he denies any complaints, he remains anxious, patient was evaluated few times today, he was on 5 L oxygen, saturating> 92% with no dyspnea  Discharge Exam: Vitals:   06/08/23 0739 06/08/23 1009  BP:    Pulse:  72  Resp:    Temp:    SpO2: 96% 92%   Vitals:   06/07/23 2028 06/07/23 2029 06/08/23 0739 06/08/23 1009  BP:      Pulse: 69 74  72  Resp: 17 17    Temp:      TempSrc:      SpO2: 96% 96% 96% 92%  Weight:      Height:        General: Pt is alert, awake, not in acute distress, frail, chronically ill-appearing,  extremely anxious Cardiovascular: RRR, S1/S2 +, no rubs, no gallops Respiratory: Good air entry today, no wheezing Abdominal: Soft, NT, ND, bowel sounds + Extremities: no edema, no cyanosis    The results of significant diagnostics from this hospitalization (including imaging, microbiology, ancillary and laboratory) are listed below for reference.     Microbiology: Recent Results (from the past 240 hours)  Resp panel by RT-PCR (RSV, Flu A&B, Covid) Anterior Nasal Swab     Status: Abnormal  Collection Time: 06/03/23  6:29 PM   Specimen: Anterior Nasal Swab  Result Value Ref Range Status   SARS Coronavirus 2 by RT PCR NEGATIVE NEGATIVE Final   Influenza A by PCR POSITIVE (A) NEGATIVE Final   Influenza B by PCR NEGATIVE NEGATIVE Final    Comment: (NOTE) The Xpert Xpress SARS-CoV-2/FLU/RSV plus assay is intended as an aid in the diagnosis of influenza from Nasopharyngeal swab specimens and should not be used as a sole basis for treatment. Nasal washings and aspirates are unacceptable for Xpert Xpress SARS-CoV-2/FLU/RSV testing.  Fact Sheet for Patients: bloggercourse.com  Fact Sheet for Healthcare Providers: seriousbroker.it  This test is not yet approved or cleared by the [place]  FDA and has been authorized for detection and/or diagnosis of SARS-CoV-2 by FDA under an Emergency Use Authorization (EUA). This EUA will remain in effect (meaning this test can be used) for the duration of the COVID-19 declaration under Section 564(b)(1) of the Act, 21 U.S.C. section 360bbb-3(b)(1), unless the authorization is terminated or revoked.     Resp Syncytial Virus by PCR NEGATIVE NEGATIVE Final    Comment: (NOTE) Fact Sheet for Patients: bloggercourse.com  Fact Sheet for Healthcare Providers: seriousbroker.it  This test is not yet approved or cleared by the [place]  FDA  and has been authorized for detection and/or diagnosis of SARS-CoV-2 by FDA under an Emergency Use Authorization (EUA). This EUA will remain in effect (meaning this test can be used) for the duration of the COVID-19 declaration under Section 564(b)(1) of the Act, 21 U.S.C. section 360bbb-3(b)(1), unless the authorization is terminated or revoked.  Performed at [facility], [address], [place], [place] [address]   Blood Culture (routine x 2)     Status: None   Collection Time: 06/03/23  6:58 PM   Specimen: BLOOD LEFT HAND  Result Value Ref Range Status   Specimen Description BLOOD LEFT HAND  Final   Special Requests   Final    BOTTLES DRAWN AEROBIC ONLY Blood Culture results may not be optimal due to an inadequate volume of blood received in culture bottles   Culture   Final    NO GROWTH 5 DAYS Performed at [facility], [address], [place], [place] [address]    Report Status 06/08/2023 FINAL  Final  Culture, blood (Routine X 2) w Reflex to ID Panel     Status: None (Preliminary result)   Collection Time: 06/03/23  9:25 PM   Specimen: BLOOD LEFT FOREARM  Result Value Ref Range Status   Specimen Description BLOOD LEFT FOREARM  Final   Special Requests   Final    BOTTLES DRAWN AEROBIC AND ANAEROBIC Blood Culture results may not be optimal due to an inadequate volume of blood received in culture bottles   Culture   Final    NO GROWTH 4 DAYS Performed at [facility], [address] [place], [place] [address]    Report Status PENDING  Incomplete  MRSA Next Gen by PCR, Nasal     Status: Abnormal   Collection Time: 06/04/23  9:22 PM   Specimen: Nasal Mucosa; Nasal Swab  Result Value Ref Range Status   MRSA by PCR Next Gen DETECTED (A) NOT DETECTED Final    Comment: RESULT CALLED TO, READ BACK BY AND VERIFIED WITH: M BURNETTE,[id number]  06/04/23 MK (NOTE) The GeneXpert MRSA Assay (FDA approved for NASAL specimens only), is one component of a comprehensive  MRSA colonization surveillance program. It is not  intended to diagnose MRSA infection nor to guide or monitor treatment for MRSA infections. Test performance is not FDA approved in patients less than 35 years old. Performed at [facility], [address] [place], [place] [address]   Respiratory (~20 pathogens) panel by PCR     Status: Abnormal   Collection Time: 06/06/23 11:18 AM   Specimen: Nasopharyngeal Swab; Respiratory  Result Value Ref Range Status   Adenovirus NOT DETECTED NOT DETECTED Final   Coronavirus 229E NOT DETECTED NOT DETECTED Final    Comment: (NOTE) The Coronavirus on the Respiratory Panel, DOES NOT test for the novel  Coronavirus (2019 nCoV)    Coronavirus HKU1 NOT DETECTED NOT DETECTED Final   Coronavirus NL63 NOT DETECTED NOT DETECTED Final   Coronavirus OC43 NOT DETECTED NOT DETECTED Final   Metapneumovirus NOT DETECTED NOT DETECTED Final   Rhinovirus / Enterovirus NOT DETECTED NOT DETECTED Final   Influenza A H3 DETECTED (A) NOT DETECTED Final   Influenza B NOT DETECTED NOT DETECTED Final   Parainfluenza Virus 1 NOT DETECTED NOT DETECTED Final   Parainfluenza Virus 2 NOT DETECTED NOT DETECTED Final   Parainfluenza Virus 3 NOT DETECTED NOT DETECTED Final   Parainfluenza Virus 4 NOT DETECTED NOT DETECTED Final   Respiratory Syncytial Virus NOT DETECTED NOT DETECTED Final   Bordetella pertussis NOT DETECTED NOT DETECTED Final   Bordetella Parapertussis NOT DETECTED NOT DETECTED Final   Chlamydophila pneumoniae NOT DETECTED NOT DETECTED Final   Mycoplasma pneumoniae NOT DETECTED NOT DETECTED Final    Comment: Performed at [facility], [address], [place], [place] [address]     Labs: BNP (last 3 results) Recent Labs    06/06/23 0426 06/07/23 0821 06/08/23 0433  BNP 21.9 40.1 28.1   Basic Metabolic Panel: Recent Labs  Lab 06/03/23 2135 06/03/23 2140 06/04/23 0420 06/06/23 0426 06/07/23 0821 06/08/23 0911  NA 135 134* 139 139  138 140  K 4.2 4.1 4.0 3.6 4.1 4.4  CL 98  --  103 97* 97* 98  CO2 23  --  25 30 30 30   GLUCOSE 112*  --  158* 94 139* 143*  BUN 6*  --  7* 13 10 15   CREATININE 0.74  --  0.79 0.75 0.65 0.80  CALCIUM 8.8*  --  8.2* 9.0 9.2 9.3  MG 1.9  --  1.9 1.8 2.2 2.2  PHOS 2.3*  --  7.9* 3.1 3.5 2.9   Liver Function Tests: Recent Labs  Lab 06/03/23 1828 06/04/23 0420 06/06/23 0426  AST 36 21  --   ALT 19 21  --   ALKPHOS 65 50  --   BILITOT 1.4* 0.6  --   PROT 7.2 5.8*  --   ALBUMIN 3.9 3.0* 3.2*   No results for input(s): LIPASE, AMYLASE in the last 168 hours. No results for input(s): AMMONIA in the last 168 hours. CBC: Recent Labs  Lab 06/03/23 1828 06/03/23 2140 06/04/23 0420 06/06/23 0426 06/07/23 0821 06/08/23 0433  WBC 8.5  --  4.8 7.0 6.8 6.4  NEUTROABS 6.9  --   --   --  5.0 5.1  HGB 15.0 13.6 12.5* 12.6* 13.7 13.4  HCT 46.6 40.0 37.7* 38.4* 40.9 40.8  MCV 93.4  --  90.8 91.4 88.7 90.1  PLT 156  --  165 181 182 156   Cardiac Enzymes: Recent Labs  Lab 06/03/23 2135  CKTOTAL 156   BNP: Invalid input(s): POCBNP CBG: Recent Labs  Lab 06/04/23 0759  GLUCAP 126*   D-Dimer No results for input(s): DDIMER in the last 72 hours. Hgb A1c No results for input(s): HGBA1C in the last 72 hours. Lipid Profile No results for input(s): CHOL, HDL, LDLCALC, TRIG, CHOLHDL, LDLDIRECT in the last 72 hours. Thyroid function studies No results for input(s): TSH, T4TOTAL, T3FREE, THYROIDAB in the last 72 hours.  Invalid input(s): FREET3 Anemia work up No results for input(s): VITAMINB12, FOLATE, FERRITIN, TIBC, IRON, RETICCTPCT in the last 72 hours. Urinalysis No results found for: COLORURINE, APPEARANCEUR, LABSPEC, PHURINE, GLUCOSEU, HGBUR, BILIRUBINUR, KETONESUR, PROTEINUR, UROBILINOGEN, NITRITE, LEUKOCYTESUR Sepsis Labs Recent Labs  Lab 06/04/23 0420 06/06/23 0426 06/07/23 0821 06/08/23 0433  WBC  4.8 7.0 6.8 6.4   Microbiology Recent Results (from the past 240 hours)  Resp panel by RT-PCR (RSV, Flu A&B, Covid) Anterior Nasal Swab     Status: Abnormal   Collection Time: 06/03/23  6:29 PM   Specimen: Anterior Nasal Swab  Result Value Ref Range Status   SARS Coronavirus 2 by RT PCR NEGATIVE NEGATIVE Final   Influenza A by PCR POSITIVE (A) NEGATIVE Final   Influenza B by PCR NEGATIVE NEGATIVE Final    Comment: (NOTE) The Xpert Xpress SARS-CoV-2/FLU/RSV plus assay is intended as an aid in the diagnosis of influenza from Nasopharyngeal swab specimens and should not be used as a sole basis for treatment. Nasal washings and aspirates are unacceptable for Xpert Xpress SARS-CoV-2/FLU/RSV testing.  Fact Sheet for Patients: bloggercourse.com  Fact Sheet for Healthcare Providers: seriousbroker.it  This test is not yet approved or cleared by the [place]  FDA and has been authorized for detection and/or diagnosis of SARS-CoV-2 by FDA under an Emergency Use Authorization (EUA). This EUA will remain in effect (meaning this test can be used) for the duration of the COVID-19 declaration under Section 564(b)(1) of the Act, 21 U.S.C. section 360bbb-3(b)(1), unless the authorization is terminated or revoked.     Resp Syncytial Virus by PCR NEGATIVE NEGATIVE Final    Comment: (NOTE) Fact Sheet for Patients: bloggercourse.com  Fact Sheet for Healthcare Providers: seriousbroker.it  This test is not yet approved or cleared by the [place]  FDA and has been authorized for detection and/or diagnosis of SARS-CoV-2 by FDA under an Emergency Use Authorization (EUA). This EUA will remain in effect (meaning this test can be used) for the duration of the COVID-19 declaration under Section 564(b)(1) of the Act, 21 U.S.C. section 360bbb-3(b)(1), unless the authorization is terminated  or revoked.  Performed at [facility], [address], [place], [place] [address]   Blood Culture (routine x 2)     Status: None   Collection Time: 06/03/23  6:58 PM   Specimen: BLOOD LEFT HAND  Result Value Ref Range Status   Specimen Description BLOOD LEFT HAND  Final   Special Requests   Final    BOTTLES DRAWN AEROBIC ONLY Blood Culture results may not be optimal due to an inadequate volume of blood received in culture bottles   Culture   Final    NO GROWTH 5 DAYS Performed at [facility], [address], [place], [place] [address]    Report Status 06/08/2023 FINAL  Final  Culture, blood (Routine X 2) w Reflex to ID Panel     Status: None (Preliminary result)   Collection Time: 06/03/23  9:25 PM   Specimen: BLOOD LEFT FOREARM  Result Value Ref Range Status   Specimen Description BLOOD LEFT FOREARM  Final  Special Requests   Final    BOTTLES DRAWN AEROBIC AND ANAEROBIC Blood Culture results may not be optimal due to an inadequate volume of blood received in culture bottles   Culture   Final    NO GROWTH 4 DAYS Performed at [facility], [address] [place], [place] [address]    Report Status PENDING  Incomplete  MRSA Next Gen by PCR, Nasal     Status: Abnormal   Collection Time: 06/04/23  9:22 PM   Specimen: Nasal Mucosa; Nasal Swab  Result Value Ref Range Status   MRSA by PCR Next Gen DETECTED (A) NOT DETECTED Final    Comment: RESULT CALLED TO, READ BACK BY AND VERIFIED WITH: M BURNETTE,[email or web address]@[id number]  06/04/23 MK (NOTE) The GeneXpert MRSA Assay (FDA approved for NASAL specimens only), is one component of a comprehensive MRSA colonization surveillance program. It is not intended to diagnose MRSA infection nor to guide or monitor treatment for MRSA infections. Test performance is not FDA approved in patients less than 65 years old. Performed at [facility], [address] [place], [place] [address]   Respiratory (~20 pathogens) panel by PCR      Status: Abnormal   Collection Time: 06/06/23 11:18 AM   Specimen: Nasopharyngeal Swab; Respiratory  Result Value Ref Range Status   Adenovirus NOT DETECTED NOT DETECTED Final   Coronavirus 229E NOT DETECTED NOT DETECTED Final    Comment: (NOTE) The Coronavirus on the Respiratory Panel, DOES NOT test for the novel  Coronavirus (2019 nCoV)    Coronavirus HKU1 NOT DETECTED NOT DETECTED Final   Coronavirus NL63 NOT DETECTED NOT DETECTED Final   Coronavirus OC43 NOT DETECTED NOT DETECTED Final   Metapneumovirus NOT DETECTED NOT DETECTED Final   Rhinovirus / Enterovirus NOT DETECTED NOT DETECTED Final   Influenza A H3 DETECTED (A) NOT DETECTED Final   Influenza B NOT DETECTED NOT DETECTED Final   Parainfluenza Virus 1 NOT DETECTED NOT DETECTED Final   Parainfluenza Virus 2 NOT DETECTED NOT DETECTED Final   Parainfluenza Virus 3 NOT DETECTED NOT DETECTED Final   Parainfluenza Virus 4 NOT DETECTED NOT DETECTED Final   Respiratory Syncytial Virus NOT DETECTED NOT DETECTED Final   Bordetella pertussis NOT DETECTED NOT DETECTED Final   Bordetella Parapertussis NOT DETECTED NOT DETECTED Final   Chlamydophila pneumoniae NOT DETECTED NOT DETECTED Final   Mycoplasma pneumoniae NOT DETECTED NOT DETECTED Final    Comment: Performed at [facility], [address], [place], [place] [address]     Time coordinating discharge: Over 30 minutes  SIGNED:   Brayton Lye, MD  [facility] 06/08/2023, 10:23 AM Pager   If 7PM-7AM, please contact night-coverage [email or web address]

## 2023-06-09 LAB — CULTURE, BLOOD (ROUTINE X 2): Culture: NO GROWTH

## 2023-07-06 ENCOUNTER — Encounter: Payer: Self-pay | Admitting: Primary Care

## 2023-07-06 ENCOUNTER — Inpatient Hospital Stay: Payer: 59 | Admitting: Primary Care

## 2023-07-06 NOTE — Progress Notes (Deleted)
 @Patient  ID: Chris [name], male    DOB: 01/07/1956, 68 y.o.   MRN: [id number]  No chief complaint on file.   Referring provider: [facility]  HPI:   No Known Allergies  Immunization History  Administered Date(s) Administered  . Tdap 08/12/2015, 05/15/2019    Past Medical History:  Diagnosis Date  . COPD (chronic obstructive pulmonary disease) (HCC)     Tobacco History: Social History   Tobacco Use  Smoking Status Former  Smokeless Tobacco Never   Counseling given: Not Answered   Outpatient Medications Prior to Visit  Medication Sig Dispense Refill  . albuterol (PROVENTIL) (2.5 MG/3ML) 0.083% nebulizer solution Take 2.5 mg by nebulization every 6 (six) hours as needed for shortness of breath or wheezing.    Marland Kitchen albuterol (VENTOLIN HFA) 108 (90 Base) MCG/ACT inhaler Inhale 2 puffs into the lungs every 4 (four) hours as needed for wheezing or shortness of breath.    . ALPRAZolam (XANAX) 1 MG tablet Take 1 mg by mouth 3 (three) times daily.    . Buprenorphine HCl-Naloxone HCl 8-2 MG FILM Place under the tongue. Place 1/4 of film under the tongue daily    . cyclobenzaprine (FLEXERIL) 10 MG tablet Take 10 mg by mouth 3 (three) times daily as needed for muscle spasms.    Marland Kitchen FLUoxetine (PROZAC) 20 MG capsule Take 20 mg by mouth daily.    . pantoprazole (PROTONIX) 40 MG tablet Take 1 tablet (40 mg total) by mouth daily. 30 tablet 0  . predniSONE (DELTASONE) 5 MG tablet Take 1 tablet (5 mg total) by mouth daily. Resume home dose after finishing steroid taper    . QUEtiapine (SEROQUEL) 100 MG tablet Take 100 mg by mouth at bedtime.    . roflumilast (DALIRESP) 500 MCG TABS tablet Take 500 mcg by mouth daily.    . tamsulosin (FLOMAX) 0.4 MG CAPS capsule Take 0.4 mg by mouth daily.    . TRELEGY ELLIPTA 100-62.5-25 MCG/ACT AEPB Inhale 1 puff into the lungs daily.     No facility-administered medications prior to visit.      Review of Systems  Review of  Systems   Physical Exam  There were no vitals taken for this visit. Physical Exam   Lab Results:  CBC    Component Value Date/Time   WBC 6.4 06/08/2023 0433   RBC 4.53 06/08/2023 0433   HGB 13.4 06/08/2023 0433   HCT 40.8 06/08/2023 0433   PLT 156 06/08/2023 0433   MCV 90.1 06/08/2023 0433   MCH 29.6 06/08/2023 0433   MCHC 32.8 06/08/2023 0433   RDW 12.6 06/08/2023 0433   LYMPHSABS 1.0 06/08/2023 0433   MONOABS 0.3 06/08/2023 0433   EOSABS 0.0 06/08/2023 0433   BASOSABS 0.0 06/08/2023 0433    BMET    Component Value Date/Time   NA 140 06/08/2023 0911   K 4.4 06/08/2023 0911   CL 98 06/08/2023 0911   CO2 30 06/08/2023 0911   GLUCOSE 143 (H) 06/08/2023 0911   BUN 15 06/08/2023 0911   CREATININE 0.80 06/08/2023 0911   CALCIUM 9.3 06/08/2023 0911   GFRNONAA >60 06/08/2023 0911   GFRAA >60 05/14/2019 1730    BNP    Component Value Date/Time   BNP 28.1 06/08/2023 0433    ProBNP No results found for: "PROBNP"  Imaging: No results found.   Assessment & Plan:   No problem-specific Assessment & Plan notes found for this encounter.     Glenford Bayley,  NP 07/06/2023

## 2024-05-28 ENCOUNTER — Other Ambulatory Visit (HOSPITAL_BASED_OUTPATIENT_CLINIC_OR_DEPARTMENT_OTHER): Payer: Self-pay | Admitting: Specialist

## 2024-05-28 DIAGNOSIS — Z87891 Personal history of nicotine dependence: Secondary | ICD-10-CM
